# Patient Record
Sex: Female | Born: 1985 | Race: White | Hispanic: No | Marital: Married | Smoking: Never smoker
Health system: Southern US, Community
[De-identification: ages and names within clinical notes are randomized; demographics above are authoritative.]

## PROBLEM LIST (undated history)

## (undated) DIAGNOSIS — N200 Calculus of kidney: Secondary | ICD-10-CM

## (undated) DIAGNOSIS — N83209 Unspecified ovarian cyst, unspecified side: Secondary | ICD-10-CM

## (undated) DIAGNOSIS — N809 Endometriosis, unspecified: Secondary | ICD-10-CM

## (undated) DIAGNOSIS — F4310 Post-traumatic stress disorder, unspecified: Secondary | ICD-10-CM

## (undated) DIAGNOSIS — N6009 Solitary cyst of unspecified breast: Secondary | ICD-10-CM

## (undated) DIAGNOSIS — J45909 Unspecified asthma, uncomplicated: Secondary | ICD-10-CM

## (undated) DIAGNOSIS — F3181 Bipolar II disorder: Secondary | ICD-10-CM

## (undated) DIAGNOSIS — D219 Benign neoplasm of connective and other soft tissue, unspecified: Secondary | ICD-10-CM

## (undated) DIAGNOSIS — F99 Mental disorder, not otherwise specified: Secondary | ICD-10-CM

## (undated) DIAGNOSIS — F209 Schizophrenia, unspecified: Secondary | ICD-10-CM

## (undated) HISTORY — PX: CHOLECYSTECTOMY: SHX55

## (undated) HISTORY — PX: APPENDECTOMY: SHX54

---

## 2012-05-28 HISTORY — PX: ABDOMINAL HYSTERECTOMY: SHX81

## 2013-03-04 ENCOUNTER — Emergency Department (HOSPITAL_COMMUNITY)
Admission: EM | Admit: 2013-03-04 | Discharge: 2013-03-05 | Disposition: A | Discharge status: Home / Self Care | Payer: Medicaid Other | Attending: Emergency Medicine | Admitting: Emergency Medicine

## 2013-03-04 ENCOUNTER — Encounter (HOSPITAL_COMMUNITY): Payer: Self-pay | Admitting: Emergency Medicine

## 2013-03-04 ENCOUNTER — Emergency Department (HOSPITAL_COMMUNITY): Payer: Medicaid Other

## 2013-03-04 DIAGNOSIS — R0602 Shortness of breath: Secondary | ICD-10-CM | POA: Insufficient documentation

## 2013-03-04 DIAGNOSIS — R109 Unspecified abdominal pain: Secondary | ICD-10-CM | POA: Insufficient documentation

## 2013-03-04 DIAGNOSIS — J40 Bronchitis, not specified as acute or chronic: Secondary | ICD-10-CM

## 2013-03-04 DIAGNOSIS — R05 Cough: Secondary | ICD-10-CM

## 2013-03-04 DIAGNOSIS — J45901 Unspecified asthma with (acute) exacerbation: Secondary | ICD-10-CM | POA: Insufficient documentation

## 2013-03-04 DIAGNOSIS — Z87442 Personal history of urinary calculi: Secondary | ICD-10-CM | POA: Insufficient documentation

## 2013-03-04 DIAGNOSIS — Z79899 Other long term (current) drug therapy: Secondary | ICD-10-CM | POA: Insufficient documentation

## 2013-03-04 DIAGNOSIS — Z3202 Encounter for pregnancy test, result negative: Secondary | ICD-10-CM | POA: Insufficient documentation

## 2013-03-04 HISTORY — DX: Calculus of kidney: N20.0

## 2013-03-04 HISTORY — DX: Unspecified asthma, uncomplicated: J45.909

## 2013-03-04 NOTE — ED Notes (Signed)
Pt. reports persistent productive cough with nasal congestion ,headache for 2 days .

## 2013-03-05 LAB — URINALYSIS, ROUTINE W REFLEX MICROSCOPIC
Bilirubin Urine: NEGATIVE
Hgb urine dipstick: NEGATIVE
Leukocytes, UA: NEGATIVE
Nitrite: NEGATIVE
Protein, ur: NEGATIVE mg/dL
Specific Gravity, Urine: 1.022 (ref 1.005–1.030)
Urobilinogen, UA: 1 mg/dL (ref 0.0–1.0)

## 2013-03-05 LAB — POCT PREGNANCY, URINE: Preg Test, Ur: NEGATIVE

## 2013-03-05 MED ORDER — HYDROCOD POLST-CHLORPHEN POLST 10-8 MG/5ML PO LQCR: 5.0000 mL | Freq: Two times a day (BID) | ORAL | Status: DC

## 2013-03-05 MED ORDER — AZITHROMYCIN 250 MG PO TABS: ORAL_TABLET | ORAL | Status: DC

## 2013-03-05 MED ORDER — GUAIFENESIN ER 600 MG PO TB12: 1200.0000 mg | ORAL_TABLET | Freq: Two times a day (BID) | ORAL | Status: DC

## 2013-03-05 MED ORDER — HYDROCOD POLST-CHLORPHEN POLST 10-8 MG/5ML PO LQCR
5.0000 mL | Freq: Once | ORAL | Status: AC
  Administered 2013-03-05: 5 mL via ORAL
  Filled 2013-03-05: qty 5

## 2013-03-05 MED ORDER — PREDNISONE 20 MG PO TABS
60.0000 mg | ORAL_TABLET | Freq: Once | ORAL | Status: AC
  Administered 2013-03-05: 60 mg via ORAL
  Filled 2013-03-05: qty 3

## 2013-03-05 MED ORDER — PREDNISONE 10 MG PO TABS: 20.0000 mg | ORAL_TABLET | Freq: Every day | ORAL | Status: DC

## 2013-03-05 MED ORDER — IPRATROPIUM BROMIDE 0.02 % IN SOLN
0.5000 mg | Freq: Once | RESPIRATORY_TRACT | Status: AC
  Administered 2013-03-05: 0.5 mg via RESPIRATORY_TRACT
  Filled 2013-03-05: qty 2.5

## 2013-03-05 MED ORDER — ALBUTEROL SULFATE (5 MG/ML) 0.5% IN NEBU
5.0000 mg | INHALATION_SOLUTION | Freq: Once | RESPIRATORY_TRACT | Status: AC
  Administered 2013-03-05: 5 mg via RESPIRATORY_TRACT
  Filled 2013-03-05: qty 1

## 2013-03-05 NOTE — ED Provider Notes (Signed)
Medical screening examination/treatment/procedure(s) were performed by non-physician practitioner and as supervising physician I was immediately available for consultation/collaboration.   Guadalupe Kerekes, MD 03/05/13 0652 

## 2013-03-05 NOTE — ED Provider Notes (Signed)
CSN: [id number]     Arrival date & time 03/04/13  2246 History   First MD Initiated Contact with Patient 03/05/13 [phone]     Chief Complaint  Patient presents with  . Cough   HPI  History provided by the patient and significant other. Patient is a 27 year old female with history of asthma and kidney stones presenting with complaints of persistent productive cough. Coughing has been present for the past 2 weeks. It seems to be worsened with some nasal congestion over the past week. She also reports throbbing headache for the past 2 days. Patient has been using her albuterol inhalers for cough and wheezing symptoms without any significant improvement. She does report some worsening shortness of breath today with only minimal improvements from her albuterol. She denies any chest pain or hemoptysis. Patient does report having some lower flank pains that feel similar to previous kidney stones. She has not noticed any hematuria, dysuria or urinary frequency. Denies any fever, chills or sweats. No nausea vomiting.    Past Medical History  Diagnosis Date  . Asthma   . Kidney stone    Past Surgical History  Procedure Laterality Date  . Abdominal hysterectomy     No family history on file. History  Substance Use Topics  . Smoking status: Never Smoker   . Smokeless tobacco: Not on file  . Alcohol Use: No   OB History   Grav Para Term Preterm Abortions TAB SAB Ect Mult Living                 Review of Systems  Constitutional: Negative for fever, chills and diaphoresis.  Respiratory: Positive for cough, shortness of breath and wheezing.   Gastrointestinal: Negative for nausea, vomiting, diarrhea and constipation.  Genitourinary: Positive for flank pain. Negative for dysuria, frequency, hematuria, vaginal bleeding and vaginal discharge.  All other systems reviewed and are negative.    Allergies  Aspirin; Morphine and related; and Latex  Home Medications   Current Outpatient Rx  Name   Route  Sig  Dispense  Refill  . acetaminophen (TYLENOL) 500 MG tablet   Oral   Take 500 mg by mouth every 6 (six) hours as needed for pain.         Marland Kitchen albuterol (PROVENTIL HFA;VENTOLIN HFA) 108 (90 BASE) MCG/ACT inhaler   Inhalation   Inhale 1-2 puffs into the lungs every 6 (six) hours as needed for wheezing.         Marland Kitchen albuterol (PROVENTIL) (2.5 MG/3ML) 0.083% nebulizer solution   Nebulization   Take 2.5 mg by nebulization every 6 (six) hours as needed for wheezing.         . mometasone-formoterol (DULERA) 100-5 MCG/ACT AERO   Inhalation   Inhale 2 puffs into the lungs 2 (two) times daily.          BP 99/65  Pulse 70  Temp(Src) 98 F (36.7 C) (Oral)  Resp 14  Wt 122 lb (55.339 kg)  SpO2 97% Physical Exam  Nursing note and vitals reviewed. Constitutional: She is oriented to person, place, and time. She appears well-developed and well-nourished. No distress.  HENT:  Head: Normocephalic.  Cardiovascular: Normal rate and regular rhythm.   Pulmonary/Chest: Effort normal. She has wheezes.  Abdominal: Soft. There is no tenderness. There is no rebound, no guarding and no CVA tenderness.  Musculoskeletal: Normal range of motion.  Neurological: She is alert and oriented to person, place, and time.  Skin: Skin is warm and dry. No rash  noted.  Psychiatric: She has a normal mood and affect. Her behavior is normal.    ED Course  Procedures   DIAGNOSTIC STUDIES: Oxygen Saturation is 97% on room air.    COORDINATION OF CARE:  Nursing notes reviewed. Vital signs reviewed. Initial pt interview and examination performed.  Treatment plan initiated: Medications  albuterol (PROVENTIL) (5 MG/ML) 0.5% nebulizer solution 5 mg (5 mg Nebulization Given 03/05/13 0113)  ipratropium (ATROVENT) nebulizer solution 0.5 mg (0.5 mg Nebulization Given 03/05/13 0113)  chlorpheniramine-HYDROcodone (TUSSIONEX) 10-8 MG/5ML suspension 5 mL (5 mLs Oral Given 03/05/13 0113)  predniSONE (DELTASONE)  tablet 60 mg (60 mg Oral Given 03/05/13 0218)    Discussed treatment plan with pt at bedside, which includes prescriptions for cough medication, Mucinex, Z-Pak, prednisone.  Pt agrees with plan.  X-ray reviewed. Lungs clear without any concerning findings.      Results for orders placed during the hospital encounter of 03/04/13  URINALYSIS, ROUTINE W REFLEX MICROSCOPIC      Result Value Range   Color, Urine YELLOW  YELLOW   APPearance CLOUDY (*) CLEAR   Specific Gravity, Urine 1.022  1.005 - 1.030   pH 6.5  5.0 - 8.0   Glucose, UA NEGATIVE  NEGATIVE mg/dL   Hgb urine dipstick NEGATIVE  NEGATIVE   Bilirubin Urine NEGATIVE  NEGATIVE   Ketones, ur NEGATIVE  NEGATIVE mg/dL   Protein, ur NEGATIVE  NEGATIVE mg/dL   Urobilinogen, UA 1.0  0.0 - 1.0 mg/dL   Nitrite NEGATIVE  NEGATIVE   Leukocytes, UA NEGATIVE  NEGATIVE  POCT PREGNANCY, URINE      Result Value Range   Preg Test, Ur NEGATIVE  NEGATIVE      Imaging Review Dg Chest 2 View  03/05/2013   *RADIOLOGY REPORT*  Clinical Data: Cough, fever and runny nose.  History of asthma.  CHEST - 2 VIEW  Comparison: None.  Findings: The lungs are well-aerated and clear.  There is no evidence of focal opacification, pleural effusion or pneumothorax.  The heart is normal in size; the mediastinal contour is within normal limits.  No acute osseous abnormalities are seen.  Clips are noted within the right upper quadrant, reflecting prior cholecystectomy.  IMPRESSION: No acute cardiopulmonary process seen.   Original Report Authenticated By: Tonia Ghent, M.D.    MDM   1. Bronchitis   2. Cough           Angus Seller, PA-C 03/05/13 0410

## 2013-03-12 ENCOUNTER — Emergency Department (HOSPITAL_COMMUNITY)
Admission: EM | Admit: 2013-03-12 | Discharge: 2013-03-12 | Disposition: A | Discharge status: Home / Self Care | Payer: Medicaid Other | Attending: Emergency Medicine | Admitting: Emergency Medicine

## 2013-03-12 ENCOUNTER — Encounter (HOSPITAL_COMMUNITY): Payer: Self-pay | Admitting: Emergency Medicine

## 2013-03-12 DIAGNOSIS — Z792 Long term (current) use of antibiotics: Secondary | ICD-10-CM | POA: Insufficient documentation

## 2013-03-12 DIAGNOSIS — Z3202 Encounter for pregnancy test, result negative: Secondary | ICD-10-CM | POA: Insufficient documentation

## 2013-03-12 DIAGNOSIS — Z79899 Other long term (current) drug therapy: Secondary | ICD-10-CM | POA: Insufficient documentation

## 2013-03-12 DIAGNOSIS — R197 Diarrhea, unspecified: Secondary | ICD-10-CM

## 2013-03-12 DIAGNOSIS — J45909 Unspecified asthma, uncomplicated: Secondary | ICD-10-CM | POA: Insufficient documentation

## 2013-03-12 DIAGNOSIS — N390 Urinary tract infection, site not specified: Secondary | ICD-10-CM | POA: Insufficient documentation

## 2013-03-12 DIAGNOSIS — Z87442 Personal history of urinary calculi: Secondary | ICD-10-CM | POA: Insufficient documentation

## 2013-03-12 DIAGNOSIS — Z91040 Latex allergy status: Secondary | ICD-10-CM | POA: Insufficient documentation

## 2013-03-12 DIAGNOSIS — IMO0002 Reserved for concepts with insufficient information to code with codable children: Secondary | ICD-10-CM | POA: Insufficient documentation

## 2013-03-12 LAB — COMPREHENSIVE METABOLIC PANEL
ALT: 11 U/L (ref 0–35)
AST: 18 U/L (ref 0–37)
Albumin: 4.5 g/dL (ref 3.5–5.2)
Alkaline Phosphatase: 66 U/L (ref 39–117)
CO2: 26 mEq/L (ref 19–32)
Chloride: 103 mEq/L (ref 96–112)
Creatinine, Ser: 0.94 mg/dL (ref 0.50–1.10)
GFR calc non Af Amer: 84 mL/min — ABNORMAL LOW (ref >90)
Potassium: 3.8 mEq/L (ref 3.5–5.1)
Total Bilirubin: 0.6 mg/dL (ref 0.3–1.2)

## 2013-03-12 LAB — URINALYSIS, ROUTINE W REFLEX MICROSCOPIC
Bilirubin Urine: NEGATIVE
Glucose, UA: NEGATIVE mg/dL
Hgb urine dipstick: NEGATIVE
Ketones, ur: NEGATIVE mg/dL
Protein, ur: NEGATIVE mg/dL
Urobilinogen, UA: 0.2 mg/dL (ref 0.0–1.0)

## 2013-03-12 LAB — CBC WITH DIFFERENTIAL/PLATELET
Basophils Absolute: 0.1 10*3/uL (ref 0.0–0.1)
Eosinophils Absolute: 0.1 10*3/uL (ref 0.0–0.7)
Eosinophils Relative: 2 % (ref 0–5)
HCT: 44.7 % (ref 36.0–46.0)
Hemoglobin: 15.7 g/dL — ABNORMAL HIGH (ref 12.0–15.0)
Lymphocytes Relative: 51 % — ABNORMAL HIGH (ref 12–46)
MCH: 31.2 pg (ref 26.0–34.0)
MCHC: 35.1 g/dL (ref 30.0–36.0)
MCV: 88.9 fL (ref 78.0–100.0)
Monocytes Absolute: 0.3 10*3/uL (ref 0.1–1.0)
Monocytes Relative: 7 % (ref 3–12)
Platelets: 280 10*3/uL (ref 150–400)
RDW: 12.7 % (ref 11.5–15.5)
WBC: 5.1 10*3/uL (ref 4.0–10.5)

## 2013-03-12 LAB — URINE MICROSCOPIC-ADD ON

## 2013-03-12 LAB — POCT PREGNANCY, URINE: Preg Test, Ur: NEGATIVE

## 2013-03-12 MED ORDER — SODIUM CHLORIDE 0.9 % IV BOLUS (SEPSIS)
1000.0000 mL | Freq: Once | INTRAVENOUS | Status: AC
  Administered 2013-03-12: 1000 mL via INTRAVENOUS

## 2013-03-12 MED ORDER — CEPHALEXIN 500 MG PO CAPS: 500.0000 mg | ORAL_CAPSULE | Freq: Four times a day (QID) | ORAL | Status: DC

## 2013-03-12 NOTE — ED Provider Notes (Signed)
CSN: [id number]     Arrival date & time 03/12/13  7829 History   First MD Initiated Contact with Patient 03/12/13 [phone]     Chief Complaint  Patient presents with  . Diarrhea   (Consider location/radiation/quality/duration/timing/severity/associated sxs/prior Treatment) HPI Comments: Patient presents to the emergency department with chief complaint of diarrhea. Patient states that the symptoms have been ongoing for the past 4 days. She denies seeing any blood in the diarrhea. Denies any fevers, chills, nausea, vomiting, or abdominal pain. She states that she has had dysuria and frequency. Additionally, patient states that she has lost 35 pounds over the course of approximately 2 months. She is being followed by her primary care provider for the weight loss, and was recommended to followup with GI. No recent travel history, particularly to [place].  The history is provided by the patient. No language interpreter was used.    Past Medical History  Diagnosis Date  . Asthma   . Kidney stone    Past Surgical History  Procedure Laterality Date  . Abdominal hysterectomy     History reviewed. No pertinent family history. History  Substance Use Topics  . Smoking status: Never Smoker   . Smokeless tobacco: Not on file  . Alcohol Use: No   OB History   Grav Para Term Preterm Abortions TAB SAB Ect Mult Living                 Review of Systems  All other systems reviewed and are negative.    Allergies  Aspirin; Latex; and Morphine and related  Home Medications   Current Outpatient Rx  Name  Route  Sig  Dispense  Refill  . acetaminophen (TYLENOL) 500 MG tablet   Oral   Take 500 mg by mouth every 6 (six) hours as needed for pain.         Marland Kitchen albuterol (PROVENTIL HFA;VENTOLIN HFA) 108 (90 BASE) MCG/ACT inhaler   Inhalation   Inhale 1-2 puffs into the lungs every 6 (six) hours as needed for wheezing.         Marland Kitchen albuterol (PROVENTIL) (2.5 MG/3ML) 0.083% nebulizer solution  Nebulization   Take 2.5 mg by nebulization every 6 (six) hours as needed for wheezing.         Marland Kitchen azithromycin (ZITHROMAX Z-PAK) 250 MG tablet      Take 2 tabs on day one.  Take 1 tab on days 2-5.   6 tablet   0   . guaiFENesin (MUCINEX) 600 MG 12 hr tablet   Oral   Take 2 tablets (1,200 mg total) by mouth 2 (two) times daily.   60 tablet   0   . loperamide (IMODIUM) 2 MG capsule   Oral   Take 2 mg by mouth 4 (four) times daily as needed for diarrhea or loose stools.         . mometasone-formoterol (DULERA) 100-5 MCG/ACT AERO   Inhalation   Inhale 2 puffs into the lungs 2 (two) times daily.         . predniSONE (DELTASONE) 10 MG tablet   Oral   Take 2 tablets (20 mg total) by mouth daily.   10 tablet   0    BP 95/81  Pulse 73  Temp(Src) 98.3 F (36.8 C) (Oral)  Resp 16  SpO2 97% Physical Exam  Nursing note and vitals reviewed. Constitutional: She is oriented to person, place, and time. She appears well-developed and well-nourished.  Well appearing  HENT:  Head: Normocephalic and atraumatic.  Eyes: Conjunctivae and EOM are normal. Pupils are equal, round, and reactive to light.  Neck: Normal range of motion. Neck supple.  Cardiovascular: Normal rate and regular rhythm.  Exam reveals no gallop and no friction rub.   No murmur heard. Pulmonary/Chest: Effort normal and breath sounds normal. No respiratory distress. She has no wheezes. She has no rales. She exhibits no tenderness.  Abdominal: Soft. Bowel sounds are normal. She exhibits no distension and no mass. There is no tenderness. There is no rebound and no guarding.  No focal abdominal tenderness, no fluid wave, or signs of peritonitis  Musculoskeletal: Normal range of motion. She exhibits no edema and no tenderness.  Neurological: She is alert and oriented to person, place, and time.  Skin: Skin is warm and dry.  Psychiatric: She has a normal mood and affect. Her behavior is normal. Judgment and thought  content normal.    ED Course  Procedures (including critical care time) Labs Review Labs Reviewed  COMPREHENSIVE METABOLIC PANEL  BASIC METABOLIC PANEL  URINALYSIS, ROUTINE W REFLEX MICROSCOPIC   Results for orders placed during the hospital encounter of 03/12/13  COMPREHENSIVE METABOLIC PANEL      Result Value Range   Sodium 141  135 - 145 mEq/L   Potassium 3.8  3.5 - 5.1 mEq/L   Chloride 103  96 - 112 mEq/L   CO2 26  19 - 32 mEq/L   Glucose, Bld 84  70 - 99 mg/dL   BUN 15  6 - 23 mg/dL   Creatinine, Ser 1.61  0.50 - 1.10 mg/dL   Calcium 9.6  8.4 - 09.6 mg/dL   Total Protein 7.7  6.0 - 8.3 g/dL   Albumin 4.5  3.5 - 5.2 g/dL   AST 18  0 - 37 U/L   ALT 11  0 - 35 U/L   Alkaline Phosphatase 66  39 - 117 U/L   Total Bilirubin 0.6  0.3 - 1.2 mg/dL   GFR calc non Af Amer 84 (*) >90 mL/min   GFR calc Af Amer >90  >90 mL/min  URINALYSIS, ROUTINE W REFLEX MICROSCOPIC      Result Value Range   Color, Urine YELLOW  YELLOW   APPearance CLOUDY (*) CLEAR   Specific Gravity, Urine 1.028  1.005 - 1.030   pH 6.0  5.0 - 8.0   Glucose, UA NEGATIVE  NEGATIVE mg/dL   Hgb urine dipstick NEGATIVE  NEGATIVE   Bilirubin Urine NEGATIVE  NEGATIVE   Ketones, ur NEGATIVE  NEGATIVE mg/dL   Protein, ur NEGATIVE  NEGATIVE mg/dL   Urobilinogen, UA 0.2  0.0 - 1.0 mg/dL   Nitrite POSITIVE (*) NEGATIVE   Leukocytes, UA SMALL (*) NEGATIVE  CBC WITH DIFFERENTIAL      Result Value Range   WBC 5.1  4.0 - 10.5 K/uL   RBC 5.03  3.87 - 5.11 MIL/uL   Hemoglobin 15.7 (*) 12.0 - 15.0 g/dL   HCT 04.5  40.9 - 81.1 %   MCV 88.9  78.0 - 100.0 fL   MCH 31.2  26.0 - 34.0 pg   MCHC 35.1  30.0 - 36.0 g/dL   RDW 91.4  78.2 - 95.6 %   Platelets 280  150 - 400 K/uL   Neutrophils Relative % 40 (*) 43 - 77 %   Neutro Abs 2.0  1.7 - 7.7 K/uL   Lymphocytes Relative 51 (*) 12 - 46 %   Lymphs Abs 2.6  0.7 - 4.0 K/uL   Monocytes Relative 7  3 - 12 %   Monocytes Absolute 0.3  0.1 - 1.0 K/uL   Eosinophils Relative 2  0  - 5 %   Eosinophils Absolute 0.1  0.0 - 0.7 K/uL   Basophils Relative 1  0 - 1 %   Basophils Absolute 0.1  0.0 - 0.1 K/uL  URINE MICROSCOPIC-ADD ON      Result Value Range   Squamous Epithelial / LPF FEW (*) RARE   WBC, UA 7-10  <3 WBC/hpf   Bacteria, UA MANY (*) RARE  POCT PREGNANCY, URINE      Result Value Range   Preg Test, Ur NEGATIVE  NEGATIVE   Dg Chest 2 View  03/05/2013   *RADIOLOGY REPORT*  Clinical Data: Cough, fever and runny nose.  History of asthma.  CHEST - 2 VIEW  Comparison: None.  Findings: The lungs are well-aerated and clear.  There is no evidence of focal opacification, pleural effusion or pneumothorax.  The heart is normal in size; the mediastinal contour is within normal limits.  No acute osseous abnormalities are seen.  Clips are noted within the right upper quadrant, reflecting prior cholecystectomy.  IMPRESSION: No acute cardiopulmonary process seen.   Original Report Authenticated By: Tonia Ghent, M.D.     MDM   1. UTI (lower urinary tract infection)   2. Diarrhea     Patient with history of diarrhea, as well as 35 pound weight loss in the past 2 months. Check basic labs, give fluids, and will reevaluate. No recent travel.  No hemoptysis.  No blood in stool. No fever, vitals are stable.   Patient discussed with Dr. Bebe Shaggy, who agrees with the plan. Followup with primary care provider or with gastroenterology for weight loss and persistent diarrhea. Return precautions are given.    Roxy Horseman, PA-C 03/12/13 1123

## 2013-03-12 NOTE — ED Notes (Addendum)
Presents to ED with c/o diarrhea, onset x4 day. Pt also c/o unintentional weight loss about 35 pounds in last 2 months. Pt taking antibiotic Zithromax for bronchitis.

## 2013-03-13 NOTE — ED Provider Notes (Signed)
Medical screening examination/treatment/procedure(s) were performed by non-physician practitioner and as supervising physician I was immediately available for consultation/collaboration.   Joya Gaskins, MD 03/13/13 [phone]

## 2013-03-14 LAB — URINE CULTURE

## 2013-03-15 ENCOUNTER — Telehealth (HOSPITAL_COMMUNITY): Payer: Self-pay | Admitting: Emergency Medicine

## 2013-03-15 NOTE — ED Notes (Signed)
Post ED Visit - Positive Culture Follow-up  Culture report reviewed by antimicrobial stewardship pharmacist: []  Wes Dulaney, Pharm.D., BCPS []  Celedonio Miyamoto, Pharm.D., BCPS []  Georgina Pillion, Pharm.D., BCPS []  [place], [address].D., BCPS, AAHIVP []  Estella Husk, Pharm.D., BCPS, AAHIVP [x]  Nadara Mustard, [address].D., BCPS  Positive urine culture Treated with Keflex, organism sensitive to the same and no further patient follow-up is required at this time.  Kylie A Holland 03/15/2013, 1:23 PM

## 2013-03-19 ENCOUNTER — Encounter: Payer: Self-pay | Admitting: Gastroenterology

## 2013-03-31 ENCOUNTER — Ambulatory Visit: Payer: Medicaid Other | Admitting: Gastroenterology

## 2013-05-07 ENCOUNTER — Encounter (HOSPITAL_COMMUNITY): Payer: Self-pay | Admitting: Emergency Medicine

## 2013-05-07 ENCOUNTER — Emergency Department (HOSPITAL_COMMUNITY)
Admission: EM | Admit: 2013-05-07 | Discharge: 2013-05-07 | Disposition: A | Discharge status: Home / Self Care | Payer: Medicaid Other | Attending: Emergency Medicine | Admitting: Emergency Medicine

## 2013-05-07 DIAGNOSIS — R109 Unspecified abdominal pain: Secondary | ICD-10-CM

## 2013-05-07 DIAGNOSIS — F17200 Nicotine dependence, unspecified, uncomplicated: Secondary | ICD-10-CM | POA: Insufficient documentation

## 2013-05-07 DIAGNOSIS — Z79899 Other long term (current) drug therapy: Secondary | ICD-10-CM | POA: Insufficient documentation

## 2013-05-07 DIAGNOSIS — J45909 Unspecified asthma, uncomplicated: Secondary | ICD-10-CM | POA: Insufficient documentation

## 2013-05-07 DIAGNOSIS — IMO0002 Reserved for concepts with insufficient information to code with codable children: Secondary | ICD-10-CM | POA: Insufficient documentation

## 2013-05-07 DIAGNOSIS — R1012 Left upper quadrant pain: Secondary | ICD-10-CM | POA: Insufficient documentation

## 2013-05-07 DIAGNOSIS — Z91040 Latex allergy status: Secondary | ICD-10-CM | POA: Insufficient documentation

## 2013-05-07 DIAGNOSIS — Z87442 Personal history of urinary calculi: Secondary | ICD-10-CM | POA: Insufficient documentation

## 2013-05-07 DIAGNOSIS — Z792 Long term (current) use of antibiotics: Secondary | ICD-10-CM | POA: Insufficient documentation

## 2013-05-07 LAB — CBC WITH DIFFERENTIAL/PLATELET
Basophils Absolute: 0 10*3/uL (ref 0.0–0.1)
Basophils Relative: 0 % (ref 0–1)
Eosinophils Relative: 2 % (ref 0–5)
HCT: 43.3 % (ref 36.0–46.0)
Lymphocytes Relative: 51 % — ABNORMAL HIGH (ref 12–46)
Lymphs Abs: 3.5 10*3/uL (ref 0.7–4.0)
MCHC: 34.6 g/dL (ref 30.0–36.0)
MCV: 91.7 fL (ref 78.0–100.0)
Monocytes Absolute: 0.4 10*3/uL (ref 0.1–1.0)
Neutro Abs: 2.8 10*3/uL (ref 1.7–7.7)
Neutrophils Relative %: 41 % — ABNORMAL LOW (ref 43–77)
Platelets: 263 10*3/uL (ref 150–400)
RDW: 12.8 % (ref 11.5–15.5)

## 2013-05-07 LAB — COMPREHENSIVE METABOLIC PANEL
ALT: 10 U/L (ref 0–35)
AST: 19 U/L (ref 0–37)
Albumin: 4.1 g/dL (ref 3.5–5.2)
Calcium: 8.8 mg/dL (ref 8.4–10.5)
Creatinine, Ser: 0.85 mg/dL (ref 0.50–1.10)
Glucose, Bld: 79 mg/dL (ref 70–99)
Potassium: 3.9 mEq/L (ref 3.5–5.1)
Sodium: 141 mEq/L (ref 135–145)
Total Bilirubin: 0.3 mg/dL (ref 0.3–1.2)
Total Protein: 6.9 g/dL (ref 6.0–8.3)

## 2013-05-07 LAB — URINALYSIS, ROUTINE W REFLEX MICROSCOPIC
Glucose, UA: NEGATIVE mg/dL
Leukocytes, UA: NEGATIVE
Protein, ur: NEGATIVE mg/dL
Specific Gravity, Urine: 1.029 (ref 1.005–1.030)
pH: 7.5 (ref 5.0–8.0)

## 2013-05-07 MED ORDER — SUCRALFATE 1 GM/10ML PO SUSP: 1.0000 g | Freq: Three times a day (TID) | ORAL | Status: DC

## 2013-05-07 MED ORDER — GI COCKTAIL ~~LOC~~
30.0000 mL | Freq: Once | ORAL | Status: AC
  Administered 2013-05-07: 30 mL via ORAL
  Filled 2013-05-07: qty 30

## 2013-05-07 MED ORDER — PANTOPRAZOLE SODIUM 40 MG PO TBEC: 40.0000 mg | DELAYED_RELEASE_TABLET | Freq: Every day | ORAL | Status: DC

## 2013-05-07 NOTE — ED Provider Notes (Signed)
CSN: [id number]     Arrival date & time 05/07/13  1537 History   None    Chief Complaint  Patient presents with  . Abdominal Pain   (Consider location/radiation/quality/duration/timing/severity/associated sxs/prior Treatment) HPI [name] is a 27 y.o. female w/ PMHx of Asthma and previous kidney stone, presents to the ED w/ complaints of epigastric and LUQ pain since yesterday. The patient claims she was doing house work when she started to feel a dull ache in her abdomen the then progressed to a sharp pain located in the epigastrium She claims the pain is worsened with coughing and sneezing. She denies any associated nausea, vomiting, fever, chills, diarrhea, or dysuria. The patient says she has a history of "migraine" headaches which have been very frequent over the past week, for which she takes 4-6 motrin a day recently. The patient also denies any hematochezia or melena. The patient has had an appendectomy and a cholecystectomy in the past.   Past Medical History  Diagnosis Date  . Asthma   . Kidney stone    Past Surgical History  Procedure Laterality Date  . Abdominal hysterectomy    . Cholecystectomy    . Appendectomy     History reviewed. No pertinent family history. History  Substance Use Topics  . Smoking status: Current Every Day Smoker  . Smokeless tobacco: Not on file  . Alcohol Use: No   OB History   Grav Para Term Preterm Abortions TAB SAB Ect Mult Living                 Review of Systems General: Denies fever, chills, diaphoresis, appetite change and fatigue.  Respiratory: Denies SOB, DOE, cough, chest tightness, and wheezing.   Cardiovascular: Denies chest pain, palpitations and leg swelling.  Gastrointestinal: Positive for epigastric and LUQ pain. Denies nausea, vomiting, diarrhea, constipation, blood in stool and abdominal distention.  Genitourinary: Denies dysuria, urgency, frequency, hematuria, flank pain and difficulty urinating.   Musculoskeletal: Denies myalgias, back pain, joint swelling, arthralgias and gait problem.  Skin: Denies pallor, rash and wounds.  Neurological: Denies dizziness, seizures, syncope, weakness, lightheadedness, numbness and headaches.  Psychiatric/Behavioral: Denies mood changes, confusion, nervousness, sleep disturbance and agitation.  Allergies  Aspirin; Latex; and Morphine and related  Home Medications   Current Outpatient Rx  Name  Route  Sig  Dispense  Refill  . acetaminophen (TYLENOL) 500 MG tablet   Oral   Take 500 mg by mouth every 6 (six) hours as needed for pain.         Hanson Kitchen albuterol (PROVENTIL HFA;VENTOLIN HFA) 108 (90 BASE) MCG/ACT inhaler   Inhalation   Inhale 1-2 puffs into the lungs every 6 (six) hours as needed for wheezing.         Hanson Kitchen albuterol (PROVENTIL) (2.5 MG/3ML) 0.083% nebulizer solution   Nebulization   Take 2.5 mg by nebulization every 6 (six) hours as needed for wheezing.         Hanson Kitchen azithromycin (ZITHROMAX Z-PAK) 250 MG tablet      Take 2 tabs on day one.  Take 1 tab on days 2-5.   6 tablet   0   . cephALEXin (KEFLEX) 500 MG capsule   Oral   Take 1 capsule (500 mg total) by mouth 4 (four) times daily.   40 capsule   0   . guaiFENesin (MUCINEX) 600 MG 12 hr tablet   Oral   Take 2 tablets (1,200 mg total) by mouth 2 (two) times daily.  60 tablet   0   . loperamide (IMODIUM) 2 MG capsule   Oral   Take 2 mg by mouth 4 (four) times daily as needed for diarrhea or loose stools.         . mometasone-formoterol (DULERA) 100-5 MCG/ACT AERO   Inhalation   Inhale 2 puffs into the lungs 2 (two) times daily.         . predniSONE (DELTASONE) 10 MG tablet   Oral   Take 2 tablets (20 mg total) by mouth daily.   10 tablet   0    Physical Exam Filed Vitals:   05/07/13 1544  BP: 117/69  Pulse: 67  Temp: 98 F (36.7 C)  TempSrc: Oral  Resp: 18  SpO2: 99%  General: Vital signs reviewed.  Patient is a well-developed and well-nourished,  in no acute distress and cooperative with exam. Alert and oriented x3.  Head: Normocephalic and atraumatic. Eyes: PERRL, EOMI, conjunctivae normal, No scleral icterus.  Neck: Supple, trachea midline, normal ROM, No JVD, masses, thyromegaly, or carotid bruit present.  Cardiovascular: RRR, S1 normal, S2 normal, no murmurs, gallops, or rubs. Pulmonary/Chest: Normal respiratory effort, CTAB, no wheezes, rales, or rhonchi. Abdominal: Soft, tender to palpation in the epigastrium and LUQ w/ minimal pain on rebound. Non-distended, bowel sounds are normal, no masses, organomegaly, or guarding present.  Musculoskeletal: No joint deformities, erythema, or stiffness, ROM full and no nontender. Extremities: No swelling or edema,  pulses symmetric and intact bilaterally. No cyanosis or clubbing. Neurological: A&O x3, Strength is normal and symmetric bilaterally, cranial nerve II-XII are grossly intact, no focal motor deficit, sensory intact to light touch bilaterally.  Skin: Warm, dry and intact. No rashes or erythema. Psychiatric: Normal mood and affect. speech and behavior is normal. Cognition and memory are normal.   ED Course  Procedures (including critical care time) Labs Review Labs Reviewed  CBC WITH DIFFERENTIAL  COMPREHENSIVE METABOLIC PANEL  LIPASE, BLOOD  URINALYSIS, ROUTINE W REFLEX MICROSCOPIC   Imaging Review No results found.  EKG Interpretation   None       MDM   [name] is a 27 y.o. female w/ PMHx of Asthma and previous kidney stone, presents to the ED w/ complaints of epigastric and LUQ pain. No associated N/V/D. Has been taking Motrin 4-6 times daily for worsening headaches. No fever, chills, or melena. Has had appendectomy and cholecystectomy in the past. No trauma, no history of alcohol abuse. Most likely gastritis at this time. -UA, CBC, CMP, and lipase, all wnl -GI cocktail provided relief.  Given labs and clinical presentation, suspect gastritis at this  time. Patient stable for discharge. Will give Rx for Protonix 40 mg po qd + Carafate susp AC/HS for discharge. Patient encouraged to follow up w/ [facility]   Courtney Paris, MD 05/07/13 1723

## 2013-05-07 NOTE — ED Notes (Signed)
Pt c/o mid abd pain worse with cough and movement x 2 days; pt denies N/V/D

## 2013-05-08 NOTE — ED Provider Notes (Signed)
This patient was seen in conjunction with the resident physician, Dr. Yetta Barre.  The documentation is accurate and details the evaluation.  On my exam, patient had epigastric pain.  Patient's condition improved substantially with GI cocktail.  This episode of pain likely due to gastritis versus gastroesophageal irritation.  With her improvement, no notable abnormal findings, she is appropriate for discharge with outpatient followup.  Gerhard Munch, MD 05/08/13 [phone]

## 2013-09-02 ENCOUNTER — Encounter (HOSPITAL_COMMUNITY): Payer: Self-pay | Admitting: *Deleted

## 2013-09-02 ENCOUNTER — Inpatient Hospital Stay (HOSPITAL_COMMUNITY): Payer: Medicaid Other

## 2013-09-02 ENCOUNTER — Inpatient Hospital Stay (HOSPITAL_COMMUNITY)
Admission: AD | Admit: 2013-09-02 | Discharge: 2013-09-02 | Disposition: A | Discharge status: Home / Self Care | Payer: Medicaid Other | Source: Ambulatory Visit | Attending: Family Medicine | Admitting: Family Medicine

## 2013-09-02 DIAGNOSIS — M549 Dorsalgia, unspecified: Secondary | ICD-10-CM | POA: Insufficient documentation

## 2013-09-02 DIAGNOSIS — R109 Unspecified abdominal pain: Secondary | ICD-10-CM

## 2013-09-02 DIAGNOSIS — Z90710 Acquired absence of both cervix and uterus: Secondary | ICD-10-CM | POA: Insufficient documentation

## 2013-09-02 DIAGNOSIS — N949 Unspecified condition associated with female genital organs and menstrual cycle: Secondary | ICD-10-CM | POA: Insufficient documentation

## 2013-09-02 DIAGNOSIS — J45909 Unspecified asthma, uncomplicated: Secondary | ICD-10-CM | POA: Insufficient documentation

## 2013-09-02 DIAGNOSIS — R102 Pelvic and perineal pain: Secondary | ICD-10-CM

## 2013-09-02 DIAGNOSIS — R1031 Right lower quadrant pain: Secondary | ICD-10-CM | POA: Insufficient documentation

## 2013-09-02 HISTORY — DX: Endometriosis, unspecified: N80.9

## 2013-09-02 HISTORY — DX: Benign neoplasm of connective and other soft tissue, unspecified: D21.9

## 2013-09-02 HISTORY — DX: Unspecified ovarian cyst, unspecified side: N83.209

## 2013-09-02 LAB — URINALYSIS, ROUTINE W REFLEX MICROSCOPIC
BILIRUBIN URINE: NEGATIVE
Glucose, UA: NEGATIVE mg/dL
Hgb urine dipstick: NEGATIVE
Ketones, ur: 15 mg/dL — AB
Leukocytes, UA: NEGATIVE
NITRITE: NEGATIVE
Protein, ur: NEGATIVE mg/dL
UROBILINOGEN UA: 0.2 mg/dL (ref 0.0–1.0)
pH: 5.5 (ref 5.0–8.0)

## 2013-09-02 LAB — WET PREP, GENITAL
Clue Cells Wet Prep HPF POC: NONE SEEN
TRICH WET PREP: NONE SEEN
WBC, Wet Prep HPF POC: NONE SEEN
Yeast Wet Prep HPF POC: NONE SEEN

## 2013-09-02 MED ORDER — KETOROLAC TROMETHAMINE 60 MG/2ML IM SOLN
60.0000 mg | Freq: Once | INTRAMUSCULAR | Status: AC
  Administered 2013-09-02: 60 mg via INTRAMUSCULAR
  Filled 2013-09-02: qty 2

## 2013-09-02 MED ORDER — IBUPROFEN 600 MG PO TABS: 600.0000 mg | ORAL_TABLET | Freq: Four times a day (QID) | ORAL | Status: DC | PRN

## 2013-09-02 NOTE — MAU Note (Addendum)
Had a hysterectomy last January 2014 due to fibroids and endometriosis and " I have been having problems with my ovaries ever since." States she has pain with intercourse. States she has lost 20-30 lbs since January 2014. Started spotting yesterday. States had hysterectomy in [place], [place]. States she has frequent UTI's.

## 2013-09-02 NOTE — MAU Provider Note (Signed)
History     CSN: [id number]  Arrival date and time: 09/02/13 1606   First Provider Initiated Contact with Patient 09/02/13 1652      Chief Complaint  Patient presents with  . Abdominal Pain   HPI  Pt is a 28 yo G3P2103 here with report of lower right sided pelvic pain that started shortly after having a hysterectomy in January 2014.  Pain occurs daily and is intermittent in nature.  Pain is described as "crampy" and rated a 7/10.  Reports loss of 30 lbs in one year.    Past Medical History  Diagnosis Date  . Asthma   . Kidney stone   . Fibroid   . Endometriosis   . Ovarian cyst     Past Surgical History  Procedure Laterality Date  . Cholecystectomy    . Appendectomy    . Abdominal hysterectomy  2014    History reviewed. No pertinent family history.  History  Substance Use Topics  . Smoking status: Never Smoker   . Smokeless tobacco: Not on file  . Alcohol Use: No    Allergies:  Allergies  Allergen Reactions  . Aspirin Rash  . Latex Rash  . Morphine And Related Rash    Prescriptions prior to admission  Medication Sig Dispense Refill  . albuterol (PROVENTIL HFA;VENTOLIN HFA) 108 (90 BASE) MCG/ACT inhaler Inhale 1-2 puffs into the lungs every 6 (six) hours as needed for wheezing.      . mometasone-formoterol (DULERA) 100-5 MCG/ACT AERO Inhale 2 puffs into the lungs 2 (two) times daily.      . Multiple Vitamins-Minerals (MULTIVITAMIN PO) Take 2 tablets by mouth daily.      . sucralfate (CARAFATE) 1 GM/10ML suspension Take 10 mLs (1 g total) by mouth 4 (four) times daily -  with meals and at bedtime.  420 mL  0  . albuterol (PROVENTIL) (2.5 MG/3ML) 0.083% nebulizer solution Take 2.5 mg by nebulization every 6 (six) hours as needed for wheezing.        Review of Systems  Constitutional: Positive for weight loss and malaise/fatigue.  Gastrointestinal: Positive for abdominal pain (pelvic). Negative for nausea, vomiting and constipation.  Genitourinary: Positive  for frequency.  Musculoskeletal: Positive for back pain.  All other systems reviewed and are negative.  Physical Exam   Blood pressure 120/73, pulse 71, temperature 98.3 F (36.8 C), temperature source Oral, resp. rate 18, height 5\' 5"  (1.651 m), weight 53.978 kg (119 lb).  Physical Exam  Constitutional: She is oriented to person, place, and time. She appears well-developed and well-nourished.  HENT:  Head: Normocephalic.  Eyes: Pupils are equal, round, and reactive to light.  Neck: Normal range of motion. Neck supple.  Cardiovascular: Normal rate, regular rhythm and normal heart sounds.   Respiratory: Effort normal and breath sounds normal.  GI: Soft. She exhibits no mass. There is tenderness. There is no guarding.  Genitourinary: Right adnexum displays tenderness. Right adnexum displays no mass. Left adnexum displays no mass and no tenderness. Vaginal discharge (white, creamy) found.  Neurological: She is alert and oriented to person, place, and time. She has normal reflexes.  Skin: Skin is warm and dry.    MAU Course  Procedures Results for orders placed during the hospital encounter of 09/02/13 (from the past 24 hour(s))  URINALYSIS, ROUTINE W REFLEX MICROSCOPIC     Status: Abnormal   Collection Time    09/02/13  4:15 PM      Result Value Ref Range  Color, Urine YELLOW  YELLOW   APPearance CLEAR  CLEAR   Specific Gravity, Urine >1.030 (*) 1.005 - 1.030   pH 5.5  5.0 - 8.0   Glucose, UA NEGATIVE  NEGATIVE mg/dL   Hgb urine dipstick NEGATIVE  NEGATIVE   Bilirubin Urine NEGATIVE  NEGATIVE   Ketones, ur 15 (*) NEGATIVE mg/dL   Protein, ur NEGATIVE  NEGATIVE mg/dL   Urobilinogen, UA 0.2  0.0 - 1.0 mg/dL   Nitrite NEGATIVE  NEGATIVE   Leukocytes, UA NEGATIVE  NEGATIVE  WET PREP, GENITAL     Status: None   Collection Time    09/02/13  5:02 PM      Result Value Ref Range   Yeast Wet Prep HPF POC NONE SEEN  NONE SEEN   Trich, Wet Prep NONE SEEN  NONE SEEN   Clue Cells Wet  Prep HPF POC NONE SEEN  NONE SEEN   WBC, Wet Prep HPF POC NONE SEEN  NONE SEEN   Ultrasound FINDINGS:  Uterus  Previous hysterectomy.  Endometrium  Previous hysterectomy.  Right ovary  Measurements: 2.3 x 2.9 x 2.7 cm. Normal appearance/no adnexal mass.  Left ovary  Measurements: 1.7 x 2.2 x 2.9 cm. Normal appearance/no adnexal mass.  Other findings  No free fluid. Normal color flow to both ovaries. Bladder is  unremarkable.  IMPRESSION:  Prior hysterectomy. Otherwise, unremarkable pelvic ultrasound.  Assessment and Plan  Pelvic Pain  Plan: Discharge to home RX Ibuprofen Refer to GYN - ? Endometriosis  Joelyn Oms, CNM

## 2013-09-03 NOTE — MAU Provider Note (Signed)
Attestation of Attending Supervision of Advanced Practitioner (PA/CNM/NP): Evaluation and management procedures were performed by the Advanced Practitioner under my supervision and collaboration.  I have reviewed the Advanced Practitioner's note and chart, and I agree with the management and plan.  Jacob Stinson, DO Attending Physician Faculty [facility], [facility]  

## 2013-10-14 ENCOUNTER — Encounter: Payer: Medicaid Other | Admitting: Obstetrics & Gynecology

## 2013-12-17 ENCOUNTER — Encounter: Payer: Self-pay | Admitting: Obstetrics & Gynecology

## 2013-12-17 ENCOUNTER — Ambulatory Visit (INDEPENDENT_AMBULATORY_CARE_PROVIDER_SITE_OTHER): Payer: Medicaid Other | Admitting: Obstetrics & Gynecology

## 2013-12-17 VITALS — BP 116/65 | HR 71 | Temp 98.0°F | Ht 66.0 in | Wt 109.9 lb

## 2013-12-17 DIAGNOSIS — R634 Abnormal weight loss: Secondary | ICD-10-CM

## 2013-12-17 DIAGNOSIS — R102 Pelvic and perineal pain: Secondary | ICD-10-CM

## 2013-12-17 DIAGNOSIS — N809 Endometriosis, unspecified: Secondary | ICD-10-CM

## 2013-12-17 DIAGNOSIS — N949 Unspecified condition associated with female genital organs and menstrual cycle: Secondary | ICD-10-CM

## 2013-12-17 MED ORDER — MEDROXYPROGESTERONE ACETATE 150 MG/ML IM SUSP
150.0000 mg | INTRAMUSCULAR | Status: DC
  Administered 2013-12-17: 150 mg via INTRAMUSCULAR

## 2013-12-17 NOTE — Progress Notes (Signed)
Patient reports on and off abdominal/pelvic pain and describes it has feeling like "how your uterus contracts after having a baby but there is no uterus there." Patient c/o of dropping from 140lb to 109lb in the last 4 months; patient does not have appetite. Patient also reports painful lump in right breast that occurs once a month lasting for a few days though reports this month painful lump lasted for 2 weeks.

## 2013-12-17 NOTE — Patient Instructions (Signed)
Pelvic Pain Female pelvic pain can be caused by many different things and start from a variety of places. Pelvic pain refers to pain that is located in the lower half of the abdomen and between your hips. The pain may occur over a short period of time (acute) or may be reoccurring (chronic). The cause of pelvic pain may be related to disorders affecting the female reproductive organs (gynecologic), but it may also be related to the bladder, kidney stones, an intestinal complication, or muscle or skeletal problems. Getting help right away for pelvic pain is important, especially if there has been severe, sharp, or a sudden onset of unusual pain. It is also important to get help right away because some types of pelvic pain can be life threatening.  CAUSES  Below are only some of the causes of pelvic pain. The causes of pelvic pain can be in one of several categories.   Gynecologic.  Pelvic inflammatory disease.  Sexually transmitted infection.  Ovarian cyst or a twisted ovarian ligament (ovarian torsion).  Uterine lining that grows outside the uterus (endometriosis).  Fibroids, cysts, or tumors.  Ovulation.  Pregnancy.  Pregnancy that occurs outside the uterus (ectopic pregnancy).  Miscarriage.  Labor.  Abruption of the placenta or ruptured uterus.  Infection.  Uterine infection (endometritis).  Bladder infection.  Diverticulitis.  Miscarriage related to a uterine infection (septic abortion).  Bladder.  Inflammation of the bladder (cystitis).  Kidney stone(s).  Gastrointestinal.  Constipation.  Diverticulitis.  Neurologic.  Trauma.  Feeling pelvic pain because of mental or emotional causes (psychosomatic).  Cancers of the bowel or pelvis. EVALUATION  Your caregiver will want to take a careful history of your concerns. This includes recent changes in your health, a careful gynecologic history of your periods (menses), and a sexual history. Obtaining your family  history and medical history is also important. Your caregiver may suggest a pelvic exam. A pelvic exam will help identify the location and severity of the pain. It also helps in the evaluation of which organ system may be involved. In order to identify the cause of the pelvic pain and be properly treated, your caregiver may order tests. These tests may include:   A pregnancy test.  Pelvic ultrasonography.  An X-ray exam of the abdomen.  A urinalysis or evaluation of vaginal discharge.  Blood tests. HOME CARE INSTRUCTIONS   Only take over-the-counter or prescription medicines for pain, discomfort, or fever as directed by your caregiver.   Rest as directed by your caregiver.   Eat a balanced diet.   Drink enough fluids to make your urine clear or pale yellow, or as directed.   Avoid sexual intercourse if it causes pain.   Apply warm or cold compresses to the lower abdomen depending on which one helps the pain.   Avoid stressful situations.   Keep a journal of your pelvic pain. Write down when it started, where the pain is located, and if there are things that seem to be associated with the pain, such as food or your menstrual cycle.  Follow up with your caregiver as directed.  SEEK MEDICAL CARE IF:  Your medicine does not help your pain.  You have abnormal vaginal discharge. SEEK IMMEDIATE MEDICAL CARE IF:   You have heavy bleeding from the vagina.   Your pelvic pain increases.   You feel light-headed or faint.   You have chills.   You have pain with urination or blood in your urine.   You have uncontrolled diarrhea   or vomiting.   You have a fever or persistent symptoms for more than 3 days.  You have a fever and your symptoms suddenly get worse.   You are being physically or sexually abused.  MAKE SURE YOU:  Understand these instructions.  Will watch your condition.  Will get help if you are not doing well or get worse. Document Released:  04/10/2004 Document Revised: 09/28/2013 Document Reviewed: 09/03/2011 Exit[employer] Patient Information 2015 [employer]. This information is not intended to replace advice given to you by your health care provider. Make sure you discuss any questions you have with your health care provider.  

## 2013-12-17 NOTE — Progress Notes (Signed)
Subjective:     Patient ID: Jane Hanson, female   DOB: 10-25-1985, 28 y.o.   MRN: [id number]  HPI pt c/o abdominal pain since after hyst in 2014.  Pt had hysterectomy for fibroids and endometriosis.  She has chronic pain PRIOR to surgery. After surgery the pain became more central.  Pt reports that she has a sono that showed fluid 'but they did not know where it was coming from'.  She reports frequent UTI's since hyst.  At least monthly.   Pt report daily stools. She denies diarrhea  Pt also denies fever or chills. Pt reports excessive weight loss and reports that she is smaller than she has ever been.    Past Surgical History  Procedure Laterality Date  . Cholecystectomy    . Appendectomy    . Abdominal hysterectomy  2014   Past Medical History  Diagnosis Date  . Asthma   . Kidney stone   . Fibroid   . Endometriosis   . Ovarian cyst    Current Outpatient Prescriptions on File Prior to Visit  Medication Sig Dispense Refill  . albuterol (PROVENTIL HFA;VENTOLIN HFA) 108 (90 BASE) MCG/ACT inhaler Inhale 1-2 puffs into the lungs every 6 (six) hours as needed for wheezing.      Marland Kitchen albuterol (PROVENTIL) (2.5 MG/3ML) 0.083% nebulizer solution Take 2.5 mg by nebulization every 6 (six) hours as needed for wheezing.      Marland Kitchen ibuprofen (ADVIL,MOTRIN) 600 MG tablet Take 1 tablet (600 mg total) by mouth every 6 (six) hours as needed.  30 tablet  0  . mometasone-formoterol (DULERA) 100-5 MCG/ACT AERO Inhale 2 puffs into the lungs 2 (two) times daily.      . Multiple Vitamins-Minerals (MULTIVITAMIN PO) Take 2 tablets by mouth daily.      . sucralfate (CARAFATE) 1 GM/10ML suspension Take 10 mLs (1 g total) by mouth 4 (four) times daily -  with meals and at bedtime.  420 mL  0   No current facility-administered medications on file prior to visit.   Allergies  Allergen Reactions  . Aspirin Rash  . Latex Rash  . Morphine And Related Rash   History   Social History  . Marital Status: Married   Spouse Name: N/A    Number of Children: N/A  . Years of Education: N/A   Occupational History  . Not on file.   Social History Main Topics  . Smoking status: Never Smoker   . Smokeless tobacco: Never Used  . Alcohol Use: No  . Drug Use: Yes    Special: Marijuana     Comment: not currently  . Sexual Activity: Yes    Birth Control/ Protection: Surgical   Other Topics Concern  . Not on file   Social History Narrative  . No narrative on file   History reviewed. No pertinent family history.     Review of Systems     Objective:   Physical Exam BP 116/65  Pulse 71  Temp(Src) 98 F (36.7 C) (Oral)  Ht 5\' 6"  (1.676 m)  Wt 109 lb 14.4 oz (49.85 kg)  BMI 17.75 kg/m2 Pt in NAD Soft, NT, ND Abd: no incision, NT, ND GU: EGBUS: no lesions Vagina: no blood in vault Cervix?Uterus: surgically Adnexa: no masses; sl tender  Breast exam: small breasts/ symmetric.  No masses, no skin changes, no masses      09/02/2013 EXAM:  TRANSABDOMINAL AND TRANSVAGINAL ULTRASOUND OF PELVIS  TECHNIQUE:  Both transabdominal and transvaginal ultrasound examinations  of the  pelvis were performed. Transabdominal technique was performed for  global imaging of the pelvis including uterus, ovaries, adnexal  regions, and pelvic cul-de-sac. It was necessary to proceed with  endovaginal exam following the transabdominal exam to visualize the  endometrium and ovaries.  COMPARISON: None  FINDINGS:  Uterus  Previous hysterectomy.  Endometrium  Previous hysterectomy.  Right ovary  Measurements: 2.3 x 2.9 x 2.7 cm. Normal appearance/no adnexal mass.  Left ovary  Measurements: 1.7 x 2.2 x 2.9 cm. Normal appearance/no adnexal mass.  Other findings  No free fluid. Normal color flow to both ovaries. Bladder is  unremarkable.  IMPRESSION:  Prior hysterectomy. Otherwise, unremarkable pelvic ultrasound.     Assessment:     Chronic pelvic pain- uncertain etiology.  Possible related to  endometriosis Breast pain/cysts- suspect benign.  Not present currently     Plan:     Lab: TSH Depo Provera 150mg  IM x 1 today repeat in 3 months F/u 74months Records from prev surgery

## 2013-12-18 LAB — TSH: TSH: 0.729 u[IU]/mL (ref 0.350–4.500)

## 2014-02-14 ENCOUNTER — Encounter (HOSPITAL_COMMUNITY): Payer: Self-pay | Admitting: Emergency Medicine

## 2014-02-14 ENCOUNTER — Emergency Department (HOSPITAL_COMMUNITY): Payer: Medicaid Other

## 2014-02-14 ENCOUNTER — Emergency Department (HOSPITAL_COMMUNITY)
Admission: EM | Admit: 2014-02-14 | Discharge: 2014-02-14 | Disposition: A | Discharge status: Home / Self Care | Payer: Medicaid Other | Attending: Emergency Medicine | Admitting: Emergency Medicine

## 2014-02-14 DIAGNOSIS — A499 Bacterial infection, unspecified: Secondary | ICD-10-CM | POA: Insufficient documentation

## 2014-02-14 DIAGNOSIS — R059 Cough, unspecified: Secondary | ICD-10-CM | POA: Insufficient documentation

## 2014-02-14 DIAGNOSIS — J45909 Unspecified asthma, uncomplicated: Secondary | ICD-10-CM | POA: Insufficient documentation

## 2014-02-14 DIAGNOSIS — Z3202 Encounter for pregnancy test, result negative: Secondary | ICD-10-CM | POA: Insufficient documentation

## 2014-02-14 DIAGNOSIS — Z79899 Other long term (current) drug therapy: Secondary | ICD-10-CM | POA: Diagnosis not present

## 2014-02-14 DIAGNOSIS — Z91040 Latex allergy status: Secondary | ICD-10-CM | POA: Diagnosis not present

## 2014-02-14 DIAGNOSIS — IMO0002 Reserved for concepts with insufficient information to code with codable children: Secondary | ICD-10-CM | POA: Insufficient documentation

## 2014-02-14 DIAGNOSIS — N760 Acute vaginitis: Secondary | ICD-10-CM | POA: Diagnosis not present

## 2014-02-14 DIAGNOSIS — B9689 Other specified bacterial agents as the cause of diseases classified elsewhere: Secondary | ICD-10-CM | POA: Diagnosis not present

## 2014-02-14 DIAGNOSIS — R05 Cough: Secondary | ICD-10-CM | POA: Insufficient documentation

## 2014-02-14 DIAGNOSIS — J069 Acute upper respiratory infection, unspecified: Secondary | ICD-10-CM | POA: Diagnosis not present

## 2014-02-14 DIAGNOSIS — Z87442 Personal history of urinary calculi: Secondary | ICD-10-CM | POA: Diagnosis not present

## 2014-02-14 LAB — POC URINE PREG, ED: Preg Test, Ur: NEGATIVE

## 2014-02-14 LAB — URINALYSIS, ROUTINE W REFLEX MICROSCOPIC
Bilirubin Urine: NEGATIVE
GLUCOSE, UA: NEGATIVE mg/dL
Hgb urine dipstick: NEGATIVE
KETONES UR: NEGATIVE mg/dL
Leukocytes, UA: NEGATIVE
Nitrite: NEGATIVE
PH: 5.5 (ref 5.0–8.0)
Protein, ur: NEGATIVE mg/dL
SPECIFIC GRAVITY, URINE: 1.029 (ref 1.005–1.030)
Urobilinogen, UA: 0.2 mg/dL (ref 0.0–1.0)

## 2014-02-14 LAB — WET PREP, GENITAL
TRICH WET PREP: NONE SEEN
YEAST WET PREP: NONE SEEN

## 2014-02-14 MED ORDER — IBUPROFEN 800 MG PO TABS
800.0000 mg | ORAL_TABLET | Freq: Once | ORAL | Status: AC
  Administered 2014-02-14: 800 mg via ORAL
  Filled 2014-02-14: qty 1

## 2014-02-14 MED ORDER — METRONIDAZOLE 500 MG PO TABS: 500.0000 mg | ORAL_TABLET | Freq: Two times a day (BID) | ORAL | Status: DC

## 2014-02-14 MED ORDER — METRONIDAZOLE 500 MG PO TABS
500.0000 mg | ORAL_TABLET | Freq: Once | ORAL | Status: AC
  Administered 2014-02-14: 500 mg via ORAL
  Filled 2014-02-14: qty 1

## 2014-02-14 MED ORDER — IBUPROFEN 800 MG PO TABS: 800.0000 mg | ORAL_TABLET | Freq: Three times a day (TID) | ORAL | Status: DC | PRN

## 2014-02-14 NOTE — ED Notes (Signed)
Patient transported to X-ray 

## 2014-02-14 NOTE — ED Notes (Signed)
Pt c/o vaginal discharge with foul odor and URI sx with productive cough

## 2014-02-14 NOTE — ED Provider Notes (Signed)
TIME SEEN: 3:50 PM  CHIEF COMPLAINT: Cough, sinus pressure, vaginal discharge  HPI: Patient is a 28 year old female with history of kidney stones, endometriosis, ovarian cysts who presents emergency department with complaints of several days of cough, sinus congestion, fever. No chest pain or shortness of breath.   She is also complaining of right flank pain, dysuria. No vaginal bleeding or discharge. No hematuria. She is status post hysterectomy, cholecystectomy and appendectomy. She reports she has had a history of STDs and PID in the past. She is sexually active with one partner.  ROS: See HPI Constitutional:  fever  Eyes: no drainage  ENT: no runny nose   Cardiovascular:  no chest pain  Resp: no SOB  GI: no vomiting GU: no dysuria Integumentary: no rash  Allergy: no hives  Musculoskeletal: no leg swelling  Neurological: no slurred speech ROS otherwise negative  PAST MEDICAL HISTORY/PAST SURGICAL HISTORY:  Past Medical History  Diagnosis Date  . Asthma   . Kidney stone   . Fibroid   . Endometriosis   . Ovarian cyst     MEDICATIONS:  Prior to Admission medications   Medication Sig Start Date End Date Taking? Authorizing Provider  albuterol (PROVENTIL HFA;VENTOLIN HFA) 108 (90 BASE) MCG/ACT inhaler Inhale 1-2 puffs into the lungs every 6 (six) hours as needed for wheezing.   Yes Historical Provider, MD  albuterol (PROVENTIL) (2.5 MG/3ML) 0.083% nebulizer solution Take 2.5 mg by nebulization every 6 (six) hours as needed for wheezing.   Yes Historical Provider, MD  Brompheniramine-Phenylephrine (COLD & ALLERGY PO) Take 1 tablet by mouth every 6 (six) hours as needed (fever/cough).   Yes Historical Provider, MD  diphenhydrAMINE (BENADRYL) 25 MG tablet Take 25 mg by mouth every 6 (six) hours as needed for allergies.   Yes Historical Provider, MD  ibuprofen (ADVIL,MOTRIN) 600 MG tablet Take 1 tablet (600 mg total) by mouth every 6 (six) hours as needed. 09/02/13  Yes Gwen Pounds, CNM  mometasone-formoterol (DULERA) 100-5 MCG/ACT AERO Inhale 2 puffs into the lungs 2 (two) times daily.   Yes Historical Provider, MD  Multiple Vitamins-Minerals (MULTIVITAMIN PO) Take 1 tablet by mouth daily.    Yes Historical Provider, MD    ALLERGIES:  Allergies  Allergen Reactions  . Aspirin Rash  . Latex Rash  . Morphine And Related Rash    SOCIAL HISTORY:  History  Substance Use Topics  . Smoking status: Never Smoker   . Smokeless tobacco: Never Used  . Alcohol Use: No    FAMILY HISTORY: History reviewed. No pertinent family history.  EXAM: BP 95/72  Pulse 78  Temp(Src) 98 F (36.7 C) (Oral)  Resp 18  SpO2 100% CONSTITUTIONAL: Alert and oriented and responds appropriately to questions. Well-appearing; well-nourished, smiling, pleasant, no distress HEAD: Normocephalic EYES: Conjunctivae clear, PERRL ENT: normal nose; no rhinorrhea; moist mucous membranes; pharynx without lesions noted NECK: Supple, no meningismus, no LAD  CARD: RRR; S1 and S2 appreciated; no murmurs, no clicks, no rubs, no gallops RESP: Normal chest excursion without splinting or tachypnea; breath sounds clear and equal bilaterally; no wheezes, no rhonchi, no rales,  ABD/GI: Normal bowel sounds; non-distended; soft, non-tender, no rebound, no guarding GU:  Normal external genitalia, I do not see a cervix present, she does have small polyps noted on her vaginal wall, minimal amount of thin white vaginal discharge, no adnexal tenderness or fullness, no vaginal bleeding BACK:  The back appears normal and is non-tender to palpation, there is no CVA tenderness  EXT: Normal ROM in all joints; non-tender to palpation; no edema; normal capillary refill; no cyanosis    SKIN: Normal color for age and race; warm NEURO: Moves all extremities equally PSYCH: The patient's mood and manner are appropriate. Grooming and personal hygiene are appropriate.  MEDICAL DECISION MAKING: Patient here with likely  viral URI, sinusitis. Chest x-ray shows no infiltrate. I do not feel she needs antibiotics at this time. She has no chest pain or shortness of breath. She is very pleasant, nontoxic-appearing, smiling and hemodynamically stable. She is complaining of vaginal discharge. She does have rare clue cells. We'll treat for possible bacterial vaginosis. Her pelvic exam is benign. Her abdominal exam is benign. Urine shows no blood or other sign of infection. I do not think this is a kidney stone, UTI or pyelonephritis. I feel she is safe to be discharged him. Have advised her to use Tylenol and Motrin for fever and pain. Suspect that her reported fevers at home may be secondary to her URI symptoms as her abdominal exam is very benign. She has not had ibuprofen in over 12 hours and is afebrile in the ED. Discussed strict return precautions. Patient verbalizes understanding and is comfortable with plan.      [place], DO 02/14/14 2338

## 2014-02-14 NOTE — Discharge Instructions (Signed)
Bacterial Vaginosis °Bacterial vaginosis is a vaginal infection that occurs when the normal balance of bacteria in the vagina is disrupted. It results from an overgrowth of certain bacteria. This is the most common vaginal infection in women of childbearing age. Treatment is important to prevent complications, especially in pregnant women, as it can cause a premature delivery. °CAUSES  °Bacterial vaginosis is caused by an increase in harmful bacteria that are normally present in smaller amounts in the vagina. Several different kinds of bacteria can cause bacterial vaginosis. However, the reason that the condition develops is not fully understood. °RISK FACTORS °Certain activities or behaviors can put you at an increased risk of developing bacterial vaginosis, including: °· Having a new sex partner or multiple sex partners. °· Douching. °· Using an intrauterine device (IUD) for contraception. °Women do not get bacterial vaginosis from toilet seats, bedding, swimming pools, or contact with objects around them. °SIGNS AND SYMPTOMS  °Some women with bacterial vaginosis have no signs or symptoms. Common symptoms include: °· Grey vaginal discharge. °· A fishlike odor with discharge, especially after sexual intercourse. °· Itching or burning of the vagina and vulva. °· Burning or pain with urination. °DIAGNOSIS  °Your health care provider will take a medical history and examine the vagina for signs of bacterial vaginosis. A sample of vaginal fluid may be taken. Your health care provider will look at this sample under a microscope to check for bacteria and abnormal cells. A vaginal pH test may also be done.  °TREATMENT  °Bacterial vaginosis may be treated with antibiotic medicines. These may be given in the form of a pill or a vaginal cream. A second round of antibiotics may be prescribed if the condition comes back after treatment.  °HOME CARE INSTRUCTIONS  °· Only take over-the-counter or prescription medicines as  directed by your health care provider. °· If antibiotic medicine was prescribed, take it as directed. Make sure you finish it even if you start to feel better. °· Do not have sex until treatment is completed. °· Tell all sexual partners that you have a vaginal infection. They should see their health care provider and be treated if they have problems, such as a mild rash or itching. °· Practice safe sex by using condoms and only having one sex partner. °SEEK MEDICAL CARE IF:  °· Your symptoms are not improving after 3 days of treatment. °· You have increased discharge or pain. °· You have a fever. °MAKE SURE YOU:  °· Understand these instructions. °· Will watch your condition. °· Will get help right away if you are not doing well or get worse. °FOR MORE INFORMATION  °Centers for Disease Control and Prevention, Division of STD Prevention: www.cdc.gov/std °American Sexual Health Association (ASHA): www.ashastd.org  °Document Released: 05/14/2005 Document Revised: 03/04/2013 Document Reviewed: 12/24/2012 °ExitCare® Patient Information ©2015 ExitCare, LLC. This information is not intended to replace advice given to you by your health care provider. Make sure you discuss any questions you have with your health care provider. ° °Upper Respiratory Infection, Adult °An upper respiratory infection (URI) is also sometimes known as the common cold. The upper respiratory tract includes the nose, sinuses, throat, trachea, and bronchi. Bronchi are the airways leading to the lungs. Most people improve within 1 week, but symptoms can last up to 2 weeks. A residual cough may last even longer.  °CAUSES °Many different viruses can infect the tissues lining the upper respiratory tract. The tissues become irritated and inflamed and often become very moist. Mucus   production is also common. A cold is contagious. You can easily spread the virus to others by oral contact. This includes kissing, sharing a glass, coughing, or sneezing.  Touching your mouth or nose and then touching a surface, which is then touched by another person, can also spread the virus. °SYMPTOMS  °Symptoms typically develop 1 to 3 days after you come in contact with a cold virus. Symptoms vary from person to person. They may include: °· Runny nose. °· Sneezing. °· Nasal congestion. °· Sinus irritation. °· Sore throat. °· Loss of voice (laryngitis). °· Cough. °· Fatigue. °· Muscle aches. °· Loss of appetite. °· Headache. °· Low-grade fever. °DIAGNOSIS  °You might diagnose your own cold based on familiar symptoms, since most people get a cold 2 to 3 times a year. Your caregiver can confirm this based on your exam. Most importantly, your caregiver can check that your symptoms are not due to another disease such as strep throat, sinusitis, pneumonia, asthma, or epiglottitis. Blood tests, throat tests, and X-rays are not necessary to diagnose a common cold, but they may sometimes be helpful in excluding other more serious diseases. Your caregiver will decide if any further tests are required. °RISKS AND COMPLICATIONS  °You may be at risk for a more severe case of the common cold if you smoke cigarettes, have chronic heart disease (such as heart failure) or lung disease (such as asthma), or if you have a weakened immune system. The very young and very old are also at risk for more serious infections. Bacterial sinusitis, middle ear infections, and bacterial pneumonia can complicate the common cold. The common cold can worsen asthma and chronic obstructive pulmonary disease (COPD). Sometimes, these complications can require emergency medical care and may be life-threatening. °PREVENTION  °The best way to protect against getting a cold is to practice good hygiene. Avoid oral or hand contact with people with cold symptoms. Wash your hands often if contact occurs. There is no clear evidence that vitamin C, vitamin E, echinacea, or exercise reduces the chance of developing a cold.  However, it is always recommended to get plenty of rest and practice good nutrition. °TREATMENT  °Treatment is directed at relieving symptoms. There is no cure. Antibiotics are not effective, because the infection is caused by a virus, not by bacteria. Treatment may include: °· Increased fluid intake. Sports drinks offer valuable electrolytes, sugars, and fluids. °· Breathing heated mist or steam (vaporizer or shower). °· Eating chicken soup or other clear broths, and maintaining good nutrition. °· Getting plenty of rest. °· Using gargles or lozenges for comfort. °· Controlling fevers with ibuprofen or acetaminophen as directed by your caregiver. °· Increasing usage of your inhaler if you have asthma. °Zinc gel and zinc lozenges, taken in the first 24 hours of the common cold, can shorten the duration and lessen the severity of symptoms. Pain medicines may help with fever, muscle aches, and throat pain. A variety of non-prescription medicines are available to treat congestion and runny nose. Your caregiver can make recommendations and may suggest nasal or lung inhalers for other symptoms.  °HOME CARE INSTRUCTIONS  °· Only take over-the-counter or prescription medicines for pain, discomfort, or fever as directed by your caregiver. °· Use a warm mist humidifier or inhale steam from a shower to increase air moisture. This may keep secretions moist and make it easier to breathe. °· Drink enough water and fluids to keep your urine clear or pale yellow. °· Rest as needed. °· Return to   work when your temperature has returned to normal or as your caregiver advises. You may need to stay home longer to avoid infecting others. You can also use a face mask and careful hand washing to prevent spread of the virus. °SEEK MEDICAL CARE IF:  °· After the first few days, you feel you are getting worse rather than better. °· You need your caregiver's advice about medicines to control symptoms. °· You develop chills, worsening shortness  of breath, or brown or red sputum. These may be signs of pneumonia. °· You develop yellow or brown nasal discharge or pain in the face, especially when you bend forward. These may be signs of sinusitis. °· You develop a fever, swollen neck glands, pain with swallowing, or white areas in the back of your throat. These may be signs of strep throat. °SEEK IMMEDIATE MEDICAL CARE IF:  °· You have a fever. °· You develop severe or persistent headache, ear pain, sinus pain, or chest pain. °· You develop wheezing, a prolonged cough, cough up blood, or have a change in your usual mucus (if you have chronic lung disease). °· You develop sore muscles or a stiff neck. °Document Released: 11/07/2000 Document Revised: 08/06/2011 Document Reviewed: 08/19/2013 °ExitCare® Patient Information ©2015 ExitCare, LLC. This information is not intended to replace advice given to you by your health care provider. Make sure you discuss any questions you have with your health care provider. ° °

## 2014-02-16 LAB — GC/CHLAMYDIA PROBE AMP
CT Probe RNA: NEGATIVE
GC Probe RNA: NEGATIVE

## 2014-03-24 ENCOUNTER — Emergency Department (HOSPITAL_COMMUNITY): Payer: Medicaid Other

## 2014-03-24 ENCOUNTER — Emergency Department (HOSPITAL_COMMUNITY)
Admission: EM | Admit: 2014-03-24 | Discharge: 2014-03-24 | Disposition: A | Discharge status: Home / Self Care | Payer: Medicaid Other | Attending: Emergency Medicine | Admitting: Emergency Medicine

## 2014-03-24 ENCOUNTER — Encounter (HOSPITAL_COMMUNITY): Payer: Self-pay | Admitting: Emergency Medicine

## 2014-03-24 DIAGNOSIS — J45909 Unspecified asthma, uncomplicated: Secondary | ICD-10-CM | POA: Diagnosis not present

## 2014-03-24 DIAGNOSIS — Z87442 Personal history of urinary calculi: Secondary | ICD-10-CM | POA: Insufficient documentation

## 2014-03-24 DIAGNOSIS — Z79899 Other long term (current) drug therapy: Secondary | ICD-10-CM | POA: Insufficient documentation

## 2014-03-24 DIAGNOSIS — Z9089 Acquired absence of other organs: Secondary | ICD-10-CM | POA: Diagnosis not present

## 2014-03-24 DIAGNOSIS — Z8742 Personal history of other diseases of the female genital tract: Secondary | ICD-10-CM | POA: Insufficient documentation

## 2014-03-24 DIAGNOSIS — Z90710 Acquired absence of both cervix and uterus: Secondary | ICD-10-CM | POA: Insufficient documentation

## 2014-03-24 DIAGNOSIS — Z91040 Latex allergy status: Secondary | ICD-10-CM | POA: Insufficient documentation

## 2014-03-24 DIAGNOSIS — Z3202 Encounter for pregnancy test, result negative: Secondary | ICD-10-CM | POA: Diagnosis not present

## 2014-03-24 DIAGNOSIS — R112 Nausea with vomiting, unspecified: Secondary | ICD-10-CM

## 2014-03-24 DIAGNOSIS — Z792 Long term (current) use of antibiotics: Secondary | ICD-10-CM | POA: Insufficient documentation

## 2014-03-24 DIAGNOSIS — R1012 Left upper quadrant pain: Secondary | ICD-10-CM | POA: Diagnosis not present

## 2014-03-24 DIAGNOSIS — R109 Unspecified abdominal pain: Secondary | ICD-10-CM

## 2014-03-24 LAB — CBC WITH DIFFERENTIAL/PLATELET
BASOS ABS: 0.1 10*3/uL (ref 0.0–0.1)
Basophils Relative: 1 % (ref 0–1)
EOS PCT: 0 % (ref 0–5)
Eosinophils Absolute: 0 10*3/uL (ref 0.0–0.7)
HCT: 45.6 % (ref 36.0–46.0)
Hemoglobin: 15.6 g/dL — ABNORMAL HIGH (ref 12.0–15.0)
Lymphocytes Relative: 28 % (ref 12–46)
Lymphs Abs: 2.4 10*3/uL (ref 0.7–4.0)
MCH: 31.5 pg (ref 26.0–34.0)
MCHC: 34.2 g/dL (ref 30.0–36.0)
MCV: 91.9 fL (ref 78.0–100.0)
MONO ABS: 0.6 10*3/uL (ref 0.1–1.0)
MONOS PCT: 7 % (ref 3–12)
Neutro Abs: 5.4 10*3/uL (ref 1.7–7.7)
Neutrophils Relative %: 64 % (ref 43–77)
Platelets: 227 10*3/uL (ref 150–400)
RBC: 4.96 MIL/uL (ref 3.87–5.11)
RDW: 12.9 % (ref 11.5–15.5)
WBC: 8.4 10*3/uL (ref 4.0–10.5)

## 2014-03-24 LAB — COMPREHENSIVE METABOLIC PANEL
ALBUMIN: 4.5 g/dL (ref 3.5–5.2)
ALT: 12 U/L (ref 0–35)
AST: 23 U/L (ref 0–37)
Alkaline Phosphatase: 61 U/L (ref 39–117)
Anion gap: 16 — ABNORMAL HIGH (ref 5–15)
BUN: 19 mg/dL (ref 6–23)
CALCIUM: 9.6 mg/dL (ref 8.4–10.5)
CO2: 22 mEq/L (ref 19–32)
Chloride: 104 mEq/L (ref 96–112)
Creatinine, Ser: 1 mg/dL (ref 0.50–1.10)
GFR calc Af Amer: 89 mL/min — ABNORMAL LOW (ref >90)
GFR, EST NON AFRICAN AMERICAN: 77 mL/min — AB (ref >90)
Glucose, Bld: 86 mg/dL (ref 70–99)
Potassium: 4.1 mEq/L (ref 3.7–5.3)
Sodium: 142 mEq/L (ref 137–147)
Total Bilirubin: 1 mg/dL (ref 0.3–1.2)
Total Protein: 7.4 g/dL (ref 6.0–8.3)

## 2014-03-24 LAB — URINALYSIS, ROUTINE W REFLEX MICROSCOPIC
GLUCOSE, UA: NEGATIVE mg/dL
Hgb urine dipstick: NEGATIVE
Ketones, ur: >80 mg/dL — AB
LEUKOCYTES UA: NEGATIVE
NITRITE: NEGATIVE
PH: 5.5 (ref 5.0–8.0)
PROTEIN: NEGATIVE mg/dL
Specific Gravity, Urine: 1.03 (ref 1.005–1.030)
Urobilinogen, UA: 0.2 mg/dL (ref 0.0–1.0)

## 2014-03-24 LAB — LIPASE, BLOOD: Lipase: 19 U/L (ref 11–59)

## 2014-03-24 LAB — PREGNANCY, URINE: Preg Test, Ur: NEGATIVE

## 2014-03-24 MED ORDER — TRAMADOL HCL 50 MG PO TABS
50.0000 mg | ORAL_TABLET | Freq: Once | ORAL | Status: AC
  Administered 2014-03-24: 50 mg via ORAL
  Filled 2014-03-24: qty 1

## 2014-03-24 MED ORDER — ONDANSETRON HCL 4 MG PO TABS: 4.0000 mg | ORAL_TABLET | Freq: Four times a day (QID) | ORAL | Status: DC

## 2014-03-24 MED ORDER — ONDANSETRON HCL 4 MG/2ML IJ SOLN
4.0000 mg | Freq: Once | INTRAMUSCULAR | Status: AC
  Administered 2014-03-24: 4 mg via INTRAVENOUS
  Filled 2014-03-24: qty 2

## 2014-03-24 MED ORDER — ONDANSETRON 4 MG PO TBDP
8.0000 mg | ORAL_TABLET | Freq: Once | ORAL | Status: AC
  Administered 2014-03-24: 8 mg via ORAL
  Filled 2014-03-24: qty 2

## 2014-03-24 MED ORDER — SODIUM CHLORIDE 0.9 % IV BOLUS (SEPSIS)
1000.0000 mL | Freq: Once | INTRAVENOUS | Status: AC
  Administered 2014-03-24: 1000 mL via INTRAVENOUS

## 2014-03-24 MED ORDER — FENTANYL CITRATE 0.05 MG/ML IJ SOLN
50.0000 ug | Freq: Once | INTRAMUSCULAR | Status: AC
  Administered 2014-03-24: 50 ug via NASAL

## 2014-03-24 MED ORDER — FENTANYL CITRATE 0.05 MG/ML IJ SOLN
INTRAMUSCULAR | Status: AC
  Filled 2014-03-24: qty 2

## 2014-03-24 MED ORDER — TRAMADOL HCL 50 MG PO TABS: 50.0000 mg | ORAL_TABLET | Freq: Four times a day (QID) | ORAL | Status: DC | PRN

## 2014-03-24 NOTE — Discharge Instructions (Signed)
For pain control you may take:   acetaminophen 975mg  (this is 3 over the counter pills) four times a day. Do not drink alcohol or combine with other medications that have acetaminophen as an ingredient (Read the labels!).  For breakthrough pain you may take Tramadol. Do not drink alcohol drive or operate heavy machinery when taking Tramadol.  Push fluids: take small frequent sips of water or Gatorade, do not drink any soda, juice or caffeinated beverages.    Slowly resume solid diet as desired. Avoid food that are spicy, contain dairy and/or have high fat content.  Aviod NSAIDs (aspirin, motrin, ibuprofen, naproxen, Aleve et Ronney Asters) for pain control because they will irritate your stomach.  Return to the emergency room for severely worsening abdominal pain, abdominal pain that localizes to a particular area (especially the right lower part of the belly), pain that persists past 8-10 hours, blood in stool or vomit, severe weakness, fainting, or fever.   Please obtain primary care using resource guide below. But the minute you were seen in the emergency room and that they will need to obtain records for further outpatient management.     Emergency Department Resource Guide 1) Find a Doctor and Pay Out of Pocket Although you won't have to find out who is covered by your insurance plan, it is a good idea to ask around and get recommendations. You will then need to call the office and see if the doctor you have chosen will accept you as a new patient and what types of options they offer for patients who are self-pay. Some doctors offer discounts or will set up payment plans for their patients who do not have insurance, but you will need to ask so you aren't surprised when you get to your appointment.  2) Contact Your Local Health Department Not all health departments have doctors that can see patients for sick visits, but many do, so it is worth a call to see if yours does. If you don't know where  your local health department is, you can check in your phone book. The CDC also has a tool to help you locate your state's health department, and many state websites also have listings of all of their local health departments.  3) Find a [facility] If your illness is not likely to be very severe or complicated, you may want to try a walk in clinic. These are popping up all over the country in pharmacies, drugstores, and shopping centers. They're usually staffed by nurse practitioners or physician assistants that have been trained to treat common illnesses and complaints. They're usually fairly quick and inexpensive. However, if you have serious medical issues or chronic medical problems, these are probably not your best option.  No Primary Care Doctor: - Call Health Connect at  [phone] - they can help you locate a primary care doctor that  accepts your insurance, provides certain services, etc. - Physician Referral Service- [phone]  Chronic Pain Problems: Organization         Address  Phone   Notes  [facility]  [phone] Patients need to be referred by their primary care doctor.   Medication Assistance: Organization         Address  Phone   Notes  [facility] Medication [facility] [address] [place], [place] [address] [phone] --Must be a resident of [facility] -- Must have NO insurance coverage whatsoever (no Medicaid/ Medicare, etc.) -- The pt.  MUST have a primary care doctor that directs their care regularly and follows them in the community   [facility]ist  [phone]   [employer]  [phone]    Agencies that provide inexpensive medical care: Organization         Address  Phone   Notes  [place]  [phone]   [facility]    [phone]   [facility] [place], [place] [address] [phone]   [place] [address], [place] [phone]   Planned Parenthood    [phone]   [facility]    [phone]   [place] and [place] [address], [place] Phone:  [phone], Fax:  [phone] Hours of Operation:  9 am - 6 pm, M-F.  Also accepts Medicaid/Medicare and self-pay.  [facility] for [place], [address], [place] Phone: [phone], Fax: [phone]. Hours of Operation:  8:30 am - 5:30 pm, M-F.  Also accepts Medicaid and self-pay.  [facility] [place] [address], [place] Phone: [phone]   [place], [place], [place] [phone], Ext. [phone] Mondays & Thursdays: 7-9 AM.  First 15 patients are seen on a first come, first serve basis.    [place] Providers:  Organization         Address  Phone   Notes  [facility] [address], [place] [phone] Also accepts self-pay patients.  [facility] [address], [place]  [phone]   [place], [address], [place] [phone]   [facility] [address] [place] [phone]   Lucianne Lei [address], [place]   [phone] Only accepts [place] Access [place] patients after they have their name applied to their card.   Self-Pay (no insurance) in [facility]:  Organization         Address  Phone   Notes  Sickle Cell Patients, [facility] Internal [facility] [place] [phone]   [facility] [place] [phone]   [facility] [place]  [place], [place], [place] [phone]   [facility]/Dr. Osei-Bonsu  [address], [place] or [address], [place] [phone] Phone number for both [place] and [place] locations is the same.  [facility] [address], [place] [phone]   [facility] [address] [place] or [address] [phone]   [facility] [address], [place] [phone], phone; [phone], fax Sees patients 1st and 3rd Saturday of every month.  Must not qualify for public or private insurance (i.e. Medicaid, Medicare, [place] Health Choice, Veterans' Benefits)  Household income should be no more than 200% of the poverty level The clinic cannot treat you if you are pregnant or think you are pregnant  Sexually transmitted diseases are not treated at the clinic.    Dental Care: Organization         Address  Phone  Notes  [facility] [address] [place] [address]  Mardene Speak [phone] Accepts children up to age 10 who are enrolled in Medicaid or [place]; pregnant women with a Medicaid card; and children who have applied for Medicaid or [place] Health Choice, but were declined, whose parents can pay a reduced fee at time of service.  [facility] Department of [facility]  [address], [place] [phone] Accepts children up to age 28 who are enrolled in [place] or [place]; pregnant women with a Medicaid card; and children who have applied for Medicaid or [place] Health Choice, but were declined, whose parents can pay a reduced fee at time of service.  [place] Adult [facility] Access PROGRAM  [place] [phone] Patients are seen by appointment only. Walk-ins are not accepted. [place] will see patients 30 years of age and older. Monday - Tuesday (8am-5pm) Most Wednesdays (8:30-5pm) $30 per visit, cash only  [facility] Adult Dental Access PROGRAM  [address], [facility] [phone] Patients are seen by appointment only. Walk-ins are not  accepted. [place] will see patients 69 years of age and older. One Wednesday Evening (Monthly: Volunteer Based).  $30 per visit, cash only  [place]  [phone] for adults; Children under age 91, call Graduate Pediatric Dentistry at [phone]. Children aged 43-14, please call [phone] to request a pediatric application.  Dental services are provided in all areas of dental care including fillings, crowns and bridges, complete and partial dentures, implants, gum treatment, root canals, and extractions. Preventive care is also provided. Treatment is provided to both adults and children. Patients are selected via a lottery and there is often a waiting list.   [facility] [address], [place]  [phone] [email or web address]   [facility] [address] [place], [place] [phone], Ext. [phone] Second and Fourth Thursday of each month, opens at 6:30 AM; Clinic ends at 9 AM.  Patients are seen on a first-come first-served basis, and a limited number are seen during each clinic.   [facility]  [address] Hillard Danker [place], [place] [phone]   Eligibility Requirements You must have lived in [place], [place], or [place] counties for at least the last three months.   You cannot be eligible for state or federal sponsored [employer], including [employer], [place], or [employer].   You generally cannot be eligible for healthcare insurance through your employer.    How to apply: Eligibility screenings are held every Tuesday and Wednesday afternoon from 1:00 pm until 4:00 pm. You do not need an appointment for the interview!  [facility] [address] [place], [place]   [place]  [place] Department  [place]  [phone]    Behavioral Health Resources in the  Community: Intensive Outpatient Programs Organization         Address  Phone  Notes  [place]. [address] [place], [place] [phone]   [facility] Outpatient [address] [place], [place]   ADS: Alcohol & Drug S[facility]cs [address], [place], [place]   [place] [address]  [place], [place] or [phone]   Substance Abuse Resources Organization         Address  Phone  Notes  Alcohol and Drug  Services  [place]  [phone]   The [place]   Chinita Pester  [phone]   Residential & Outpatient Substance Abuse Program  [phone]   Psychological Services Organization         Address  Phone  Notes  [place]  [place]  [phone]   [place] [address], [place] or [phone]    Mobile Crisis Teams Organization         Address  Phone  Notes  [facility], Mobile Crisis Care Unit  [phone]   [facility]  [address] [place], [place]   [place] Levels [address] [place] [phone]    Self-Help/Support Groups Organization         Address  Phone             Notes  [place]. of [place] - variety of support groups  [place] Call for more information  Narcotics Anonymous (NA), [facility] [address], [employer] [place]  2 meetings at this location   Special educational needs teacher         Address  Phone  Notes  [facility] Residential Treatment [place],    [place]  [phone]   [facility]  [address], [place] [id number], [place], [place]   [place], [place] [phone] Admissions: 8am-3pm M-F  Incentives Substance [place] [phone],    [place], [place] [phone]   [facility] [address] [place] [address] [place], [place], [place]   [facility] [address],  [place], [place]   [facility] - Intensive Outpatient [address] Kristeen Mans [address], [place], [place]   [facility] ([place].,  [place], [place] [phone] or [phone]   [facility] (RTS) [address], [place], [place] Accepts Medicaid  [place] [address],  [place] [phone] Substance Abuse/Addiction Treatment   [facility] Organization         Address  Phone  Notes  [facility]  [phone]   Domenic Schwab, PhD [address] Arlis Porta [place], [place]   [phone] or [phone]   [place], [place] [phone]   [facility] [address] [place], [place] [phone] Insurance/Medicaid/sponsorship through [facility] and Families [address], [address]                                    [place], [place] [phone] [place] [employer] [address], [place] [phone]    Dr. Adele Schilder  [phone]   [facility] Dept. 1) [address] [place] 2) [place] 3)  [place] [address], [place] [phone]  [phone]   [place] [phone] or [phone] (After Hours)

## 2014-03-24 NOTE — ED Notes (Signed)
Pt to u/s

## 2014-03-24 NOTE — ED Provider Notes (Signed)
Medical screening examination/treatment/procedure(s) were performed by non-physician practitioner and as supervising physician I was immediately available for consultation/collaboration.   EKG Interpretation None        Hoy Morn, MD 03/24/14 1801

## 2014-03-24 NOTE — ED Provider Notes (Signed)
CSN: [id number]     Arrival date & time 03/24/14  1333 History   First MD Initiated Contact with Patient 03/24/14 1516     Chief Complaint  Patient presents with  . Abdominal Pain  . Emesis     (Consider location/radiation/quality/duration/timing/severity/associated sxs/prior Treatment) HPI  Jane Hanson is a 28 y.o. female with past medical history significant for kidney stones, asthma, endometriosis complaining of acute onset of left upper quadrant abdominal pain, rated a 10 out of 10 radiating to the left back at 1 PM today. She has had 3 episodes of nonbloody, nonbilious, none coffee-ground appearance emesis since that time. She denies fever, chills, chest pain, cough, shortness of breath, change in bowel habits, dysuria, hematuria, concentrated or foul-smelling urine. She endorses sensation of incomplete voiding on review of systems. She took ibuprofen at home and was given triage initiated fentanyl with little relief. States that this does not feel like prior episodes of kidney stones.  Past Medical History  Diagnosis Date  . Asthma   . Kidney stone   . Fibroid   . Endometriosis   . Ovarian cyst    Past Surgical History  Procedure Laterality Date  . Cholecystectomy    . Appendectomy    . Abdominal hysterectomy  2014   History reviewed. No pertinent family history. History  Substance Use Topics  . Smoking status: Never Smoker   . Smokeless tobacco: Never Used  . Alcohol Use: No   OB History   Grav Para Term Preterm Abortions TAB SAB Ect Mult Living   3 3 2 1      3      Review of Systems  10 systems reviewed and found to be negative, except as noted in the HPI.   Allergies  Aspirin; Latex; and Morphine and related  Home Medications   Prior to Admission medications   Medication Sig Start Date End Date Taking? Authorizing Provider  albuterol (PROVENTIL HFA;VENTOLIN HFA) 108 (90 BASE) MCG/ACT inhaler Inhale 1-2 puffs into the lungs every 6 (six) hours as  needed for wheezing.   Yes Historical Provider, MD  albuterol (PROVENTIL) (2.5 MG/3ML) 0.083% nebulizer solution Take 2.5 mg by nebulization every 6 (six) hours as needed for wheezing.   Yes Historical Provider, MD  ibuprofen (ADVIL,MOTRIN) 200 MG tablet Take 200 mg by mouth every 6 (six) hours as needed.   Yes Historical Provider, MD  ibuprofen (ADVIL,MOTRIN) 200 MG tablet Take 400 mg by mouth every 6 (six) hours as needed for headache or mild pain.   Yes Historical Provider, MD  metroNIDAZOLE (FLAGYL) 500 MG tablet Take 1 tablet (500 mg total) by mouth 2 (two) times daily. Do not drink alcohol with this medication. 02/14/14  Yes Kristen N Ward, DO  mometasone-formoterol (DULERA) 100-5 MCG/ACT AERO Inhale 2 puffs into the lungs daily.    Yes Historical Provider, MD  ondansetron (ZOFRAN) 4 MG tablet Take 1 tablet (4 mg total) by mouth every 6 (six) hours. 03/24/14   Kimiya Brunelle, PA-C  traMADol (ULTRAM) 50 MG tablet Take 1 tablet (50 mg total) by mouth every 6 (six) hours as needed. 03/24/14   Kadedra Vanaken, PA-C   BP 115/64  Pulse 85  Temp(Src) 98.4 F (36.9 C) (Oral)  Resp 15  SpO2 98% Physical Exam  Nursing note and vitals reviewed. Constitutional: She is oriented to person, place, and time. She appears well-developed and well-nourished. No distress.  Well appearing, listening to music playing from phone and watching TV.   HENT:  Head: Normocephalic and atraumatic.  Eyes: Conjunctivae and EOM are normal. Pupils are equal, round, and reactive to light.  Neck: Normal range of motion.  Cardiovascular: Normal rate, regular rhythm and intact distal pulses.   Pulmonary/Chest: Effort normal and breath sounds normal. No stridor. No respiratory distress. She has no wheezes. She has no rales. She exhibits no tenderness.  Abdominal: Soft. Bowel sounds are normal. She exhibits no distension and no mass. There is tenderness. There is no rebound and no guarding.  Mild tenderness to deep  palpation in the left upper quadrant with no guarding or rebound  Genitourinary:  No CVA tenderness to palpation bilaterally  Musculoskeletal: Normal range of motion.  Neurological: She is alert and oriented to person, place, and time.  Psychiatric: She has a normal mood and affect.    ED Course  Procedures (including critical care time) Labs Review Labs Reviewed  CBC WITH DIFFERENTIAL - Abnormal; Notable for the following:    Hemoglobin 15.6 (*)    All other components within normal limits  COMPREHENSIVE METABOLIC PANEL - Abnormal; Notable for the following:    GFR calc non Af Amer 77 (*)    GFR calc Af Amer 89 (*)    Anion gap 16 (*)    All other components within normal limits  URINALYSIS, ROUTINE W REFLEX MICROSCOPIC - Abnormal; Notable for the following:    Color, Urine AMBER (*)    APPearance HAZY (*)    Bilirubin Urine SMALL (*)    Ketones, ur >80 (*)    All other components within normal limits  LIPASE, BLOOD  PREGNANCY, URINE  POC URINE PREG, ED    Imaging Review US Renal  03/24/2014   CLINICAL DATA:  Left flank pain.  EXAM: RENAL/URINARY TRACT ULTRASOUND COMPLETE  COMPARISON:  None.  FINDINGS: Right Kidney:  Length: 10.4 cm. Echogenicity within normal limits. No mass or hydronephrosis visualized.  Left Kidney:  Length: 10.5 cm. Echogenicity within normal limits. No mass or hydronephrosis visualized.  Bladder:  Appears normal for degree of bladder distention.  IMPRESSION: Normal exam.   Electronically Signed   By: Marcello Moores  Register   On: 03/24/2014 16:55     EKG Interpretation None      MDM   Final diagnoses:  Left flank pain  Non-intractable vomiting with nausea, vomiting of unspecified type  LUQ pain    Filed Vitals:   03/24/14 1342 03/24/14 1739  BP: 122/88 115/64  Pulse: 95 85  Temp: 98 F (36.7 C) 98.4 F (36.9 C)  TempSrc: Oral Oral  Resp: 20 15  SpO2: 97% 98%    Medications  fentaNYL (SUBLIMAZE) 0.05 MG/ML injection (not administered)   ondansetron (ZOFRAN-ODT) disintegrating tablet 8 mg (8 mg Oral Given 03/24/14 1346)  fentaNYL (SUBLIMAZE) injection 50 mcg (50 mcg Nasal Given 03/24/14 1347)  sodium chloride 0.9 % bolus 1,000 mL (0 mLs Intravenous Stopped 03/24/14 1713)  ondansetron (ZOFRAN) injection 4 mg (4 mg Intravenous Given 03/24/14 1617)  traMADol (ULTRAM) tablet 50 mg (50 mg Oral Given 03/24/14 1712)    Jane Hanson is a 28 y.o. female presenting with acute onset of left upper quadrant radiating to the left flank several episodes of nausea and vomiting. Vital signs without abnormality. Abdominal exam is benign. No CVA tenderness to palpation. Patient has history of kidney stones but I doubt that is the cause of her pain, she looks far to comfortable. Plan his blood work, UA, pain medication and renal ultrasound.  Serial abdominal exams remained benign  with a very mild tenderness outpatient in the left upper quadrant. Ultrasound with no hydronephrosis. No significant abnormality on blood work. Urinalysis does appear to be infected however she has a significant amount of ketones. On further questioning of the patient she states that she's been eating and drinking normally. Blood glucoses and normal. I've encouraged patient to maintain aggressive hydration.   Patient has past by mouth challenge. Appropriate for an amenable to discharge at this time. We've had an extensive discussion of return precautions.  Evaluation does not show pathology that would require ongoing emergent intervention or inpatient treatment. Pt is hemodynamically stable and mentating appropriately. Discussed findings and plan with patient/guardian, who agrees with care plan. All questions answered. Return precautions discussed and outpatient follow up given.   New Prescriptions   ONDANSETRON (ZOFRAN) 4 MG TABLET    Take 1 tablet (4 mg total) by mouth every 6 (six) hours.   TRAMADOL (ULTRAM) 50 MG TABLET    Take 1 tablet (50 mg total) by mouth every 6  (six) hours as needed.         Monico Blitz, PA-C 03/24/14 1751

## 2014-03-24 NOTE — ED Notes (Signed)
Pt drinking sprite  

## 2014-03-24 NOTE — ED Notes (Signed)
Pt reports onset one hour ago of left side abd pain, n/v/d and pain radiates around to her back. Mild difficulty urinating and hx of kidney stones. Pt tearful at triage.

## 2014-03-28 DIAGNOSIS — F209 Schizophrenia, unspecified: Secondary | ICD-10-CM

## 2014-03-28 DIAGNOSIS — F4310 Post-traumatic stress disorder, unspecified: Secondary | ICD-10-CM

## 2014-03-28 DIAGNOSIS — F3181 Bipolar II disorder: Secondary | ICD-10-CM

## 2014-03-28 HISTORY — DX: Post-traumatic stress disorder, unspecified: F43.10

## 2014-03-28 HISTORY — DX: Schizophrenia, unspecified: F20.9

## 2014-03-28 HISTORY — DX: Bipolar II disorder: F31.81

## 2014-03-29 ENCOUNTER — Encounter (HOSPITAL_COMMUNITY): Payer: Self-pay | Admitting: Emergency Medicine

## 2014-04-27 ENCOUNTER — Emergency Department (HOSPITAL_COMMUNITY)
Admission: EM | Admit: 2014-04-27 | Discharge: 2014-04-27 | Disposition: A | Discharge status: Home / Self Care | Payer: Medicaid Other | Attending: Emergency Medicine | Admitting: Emergency Medicine

## 2014-04-27 ENCOUNTER — Encounter (HOSPITAL_COMMUNITY): Payer: Self-pay | Admitting: Emergency Medicine

## 2014-04-27 DIAGNOSIS — R0981 Nasal congestion: Secondary | ICD-10-CM | POA: Insufficient documentation

## 2014-04-27 DIAGNOSIS — Z8542 Personal history of malignant neoplasm of other parts of uterus: Secondary | ICD-10-CM | POA: Insufficient documentation

## 2014-04-27 DIAGNOSIS — N390 Urinary tract infection, site not specified: Secondary | ICD-10-CM | POA: Insufficient documentation

## 2014-04-27 DIAGNOSIS — N842 Polyp of vagina: Secondary | ICD-10-CM | POA: Insufficient documentation

## 2014-04-27 DIAGNOSIS — Z3202 Encounter for pregnancy test, result negative: Secondary | ICD-10-CM | POA: Diagnosis not present

## 2014-04-27 DIAGNOSIS — Z87442 Personal history of urinary calculi: Secondary | ICD-10-CM | POA: Insufficient documentation

## 2014-04-27 DIAGNOSIS — Z91040 Latex allergy status: Secondary | ICD-10-CM | POA: Diagnosis not present

## 2014-04-27 DIAGNOSIS — Z79899 Other long term (current) drug therapy: Secondary | ICD-10-CM | POA: Diagnosis not present

## 2014-04-27 DIAGNOSIS — J45909 Unspecified asthma, uncomplicated: Secondary | ICD-10-CM | POA: Insufficient documentation

## 2014-04-27 DIAGNOSIS — N898 Other specified noninflammatory disorders of vagina: Secondary | ICD-10-CM | POA: Diagnosis present

## 2014-04-27 LAB — URINE MICROSCOPIC-ADD ON

## 2014-04-27 LAB — WET PREP, GENITAL
Clue Cells Wet Prep HPF POC: NONE SEEN
Trich, Wet Prep: NONE SEEN
WBC, Wet Prep HPF POC: NONE SEEN
Yeast Wet Prep HPF POC: NONE SEEN

## 2014-04-27 LAB — URINALYSIS, ROUTINE W REFLEX MICROSCOPIC
Bilirubin Urine: NEGATIVE
GLUCOSE, UA: NEGATIVE mg/dL
Hgb urine dipstick: NEGATIVE
Ketones, ur: NEGATIVE mg/dL
Nitrite: POSITIVE — AB
PH: 5 (ref 5.0–8.0)
PROTEIN: NEGATIVE mg/dL
Specific Gravity, Urine: 1.018 (ref 1.005–1.030)
Urobilinogen, UA: 0.2 mg/dL (ref 0.0–1.0)

## 2014-04-27 LAB — POC URINE PREG, ED: PREG TEST UR: NEGATIVE

## 2014-04-27 MED ORDER — CEPHALEXIN 500 MG PO CAPS: 500.0000 mg | ORAL_CAPSULE | Freq: Two times a day (BID) | ORAL | Status: DC

## 2014-04-27 NOTE — Discharge Instructions (Signed)
Return to the emergency room with worsening of symptoms, new symptoms or with symptoms that are concerning, especially abdominal pain, fevers, nausea, unable to tolerate fluids or back pain. Please take all of your antibiotics until finished!   You may develop abdominal discomfort or diarrhea from the antibiotic.  You may help offset this with probiotics which you can buy or get in yogurt. Do not eat  or take the probiotics until 2 hours after your antibiotic.  [facility]up with [facility] for your vaginal polyps.

## 2014-04-27 NOTE — ED Notes (Signed)
Itching and burning and white discharge with fishy odor. Also reports congested with drainage

## 2014-04-27 NOTE — ED Provider Notes (Signed)
CSN: [id number]     Arrival date & time 04/27/14  1914 History   First MD Initiated Contact with Patient 04/27/14 1134     Chief Complaint  Patient presents with  . Vaginal Discharge  . Nasal Congestion     (Consider location/radiation/quality/duration/timing/severity/associated sxs/prior Treatment) HPI  Jane Hanson is a 28 y.o. female with PMH of endometriosis, fibroids, abdominal hysterectomy, cholecystectomy, appendectomy presenting with increased white thin vaginal discharge with fishy odor. Patient states she had bacterial vaginosis 2 weeks ago and was treated with Flagyl. This feels very similar. She denies any abdominal pain, nausea, vomiting, diarrhea. No back pain. Patient does not have menstrual periods. She endorses pain with intercourse. No history of STDs. She also endorses frequency, foul smell to urine and intermittent dysuria. She denies fevers, chills. Patient also endorses nasal congestion with nasal discharge that is mildly thick for 6 days. Patient endorses rhinorrhea as well. No fevers, cough, chest pain, shortness of breath, difficulty breathing or sore throat. Patient is not taken anything for this.   Past Medical History  Diagnosis Date  . Asthma   . Kidney stone   . Fibroid   . Endometriosis   . Ovarian cyst    Past Surgical History  Procedure Laterality Date  . Cholecystectomy    . Appendectomy    . Abdominal hysterectomy  2014   History reviewed. No pertinent family history. History  Substance Use Topics  . Smoking status: Never Smoker   . Smokeless tobacco: Never Used  . Alcohol Use: No   OB History    Gravida Para Term Preterm AB TAB SAB Ectopic Multiple Living   3 3 2 1      3      Review of Systems  Constitutional: Negative for fever and chills.  HENT: Positive for congestion and rhinorrhea.   Respiratory: Negative for cough and shortness of breath.   Cardiovascular: Negative for chest pain.  Gastrointestinal: Negative for nausea,  vomiting, abdominal pain and diarrhea.  Genitourinary: Positive for dysuria. Negative for hematuria.  Musculoskeletal: Negative for back pain.  Neurological: Negative for weakness and headaches.      Allergies  Aspirin; Latex; and Morphine and related  Home Medications   Prior to Admission medications   Medication Sig Start Date End Date Taking? Authorizing Provider  albuterol (PROVENTIL HFA;VENTOLIN HFA) 108 (90 BASE) MCG/ACT inhaler Inhale 1-2 puffs into the lungs every 6 (six) hours as needed for wheezing.   Yes Historical Provider, MD  ibuprofen (ADVIL,MOTRIN) 200 MG tablet Take 400 mg by mouth every 6 (six) hours as needed for headache or mild pain.   Yes Historical Provider, MD  mometasone-formoterol (DULERA) 100-5 MCG/ACT AERO Inhale 2 puffs into the lungs daily.    Yes Historical Provider, MD  traMADol (ULTRAM) 50 MG tablet Take 1 tablet (50 mg total) by mouth every 6 (six) hours as needed. Patient taking differently: Take 50 mg by mouth every 6 (six) hours as needed (pain).  03/24/14  Yes Nicole Pisciotta, PA-C  albuterol (PROVENTIL) (2.5 MG/3ML) 0.083% nebulizer solution Take 2.5 mg by nebulization every 6 (six) hours as needed for wheezing.    Historical Provider, MD  cephALEXin (KEFLEX) 500 MG capsule Take 1 capsule (500 mg total) by mouth 2 (two) times daily. 04/27/14   Pura Spice, PA-C  metroNIDAZOLE (FLAGYL) 500 MG tablet Take 1 tablet (500 mg total) by mouth 2 (two) times daily. Do not drink alcohol with this medication. Patient not taking: Reported on 04/27/2014 02/14/14  Kristen N Ward, DO  ondansetron (ZOFRAN) 4 MG tablet Take 1 tablet (4 mg total) by mouth every 6 (six) hours. Patient not taking: Reported on 04/27/2014 03/24/14   Elmyra Ricks Pisciotta, PA-C   BP 106/63 mmHg  Pulse 87  Temp(Src) 98.1 F (36.7 C) (Oral)  Resp 18  SpO2 99% Physical Exam  Constitutional: She appears well-developed and well-nourished. No distress.  HENT:  Head: Normocephalic and  atraumatic.  Nose: Right sinus exhibits no maxillary sinus tenderness and no frontal sinus tenderness. Left sinus exhibits no maxillary sinus tenderness and no frontal sinus tenderness.  Mouth/Throat: Mucous membranes are normal. Posterior oropharyngeal erythema present. No oropharyngeal exudate or posterior oropharyngeal edema.  Eyes: Conjunctivae and EOM are normal. Right eye exhibits no discharge. Left eye exhibits no discharge.  Neck: Normal range of motion. Neck supple.  Cardiovascular: Normal rate, regular rhythm and normal heart sounds.   Pulmonary/Chest: Effort normal and breath sounds normal. No stridor. No respiratory distress. She has no wheezes. She has no rales.  Abdominal: Soft. Bowel sounds are normal. She exhibits no distension. There is no tenderness.  Genitourinary:   No cervix present. Pt with vaginal polyps that are tender to palpation worse on right. Visualized polyps without evidence of infection. No right or left adnexal tenderness. Minimal amt of thin white discharge with mild foul odor. No blood. Nurse in room for exam.   Lymphadenopathy:    She has no cervical adenopathy.  Neurological: She is alert. She exhibits normal muscle tone. Coordination normal.  Skin: Skin is warm and dry. She is not diaphoretic.  Nursing note and vitals reviewed.   ED Course  Procedures (including critical care time) Labs Review Labs Reviewed  URINALYSIS, ROUTINE W REFLEX MICROSCOPIC - Abnormal; Notable for the following:    APPearance CLOUDY (*)    Nitrite POSITIVE (*)    Leukocytes, UA SMALL (*)    All other components within normal limits  URINE MICROSCOPIC-ADD ON - Abnormal; Notable for the following:    Bacteria, UA MANY (*)    All other components within normal limits  WET PREP, GENITAL  GC/CHLAMYDIA PROBE AMP  POC URINE PREG, ED    Imaging Review No results found.   EKG Interpretation None      MDM   Final diagnoses:  UTI (lower urinary tract infection)   Vaginal polyp   Pt has been diagnosed with a UTI. Pt is afebrile, no CVA tenderness, abdominal tenderness, normotensive, and denies N/V. Pt to be dc home with antibiotics and instructions to follow up with PCP if symptoms persist. Pt with mild symptoms of allergies without cough, sore throat. Lungs CTAB. No stridor.  No oropharynx edema. I doubt pharyngitis or PNA. Pt to treat with sudafed.  Pt also with vaginal polyps on exam, with tenderness. Visualized polyps without signs of infection. [facility]   Discussed return precautions with patient. Discussed all results and patient verbalizes understanding and agrees with plan.  Case has been discussed with Dr. Mingo Amber who agrees with the above plan and to discharge.        Pura Spice, PA-C 04/27/14 1414  Evelina Bucy, MD 04/27/14 [phone]

## 2014-04-28 LAB — GC/CHLAMYDIA PROBE AMP
CT Probe RNA: NEGATIVE
GC PROBE AMP APTIMA: NEGATIVE

## 2014-06-07 ENCOUNTER — Telehealth: Payer: Self-pay | Admitting: *Deleted

## 2014-06-07 NOTE — Telephone Encounter (Signed)
Jane Hanson called and left a message that she has vaginal polyps causing severe pain in abdominal area.  [name] and per discussion with her and chart review was seen at [facility] in December 1 and told had vaginal polyps.  She states she is taking motrin 3 tabs every 4 to 6 hours without any relief. States she is out of tramadol given in October.  History of fibroids and endometriosis with hysterectomy in 2014. Instructed her not to take more than 3 tabs of motrin in 6 hours.Instructed to keep the appointment she has scheduled in February .  Instructed her to come to [facility] for evaluation as our doctors can not prescribe pain medicine without seeing her and evaluating her first.

## 2014-06-15 ENCOUNTER — Encounter (HOSPITAL_COMMUNITY): Payer: Self-pay | Admitting: *Deleted

## 2014-06-15 ENCOUNTER — Inpatient Hospital Stay (HOSPITAL_COMMUNITY)
Admission: AD | Admit: 2014-06-15 | Discharge: 2014-06-15 | Disposition: A | Discharge status: Home / Self Care | Payer: Medicaid Other | Source: Ambulatory Visit | Attending: Obstetrics and Gynecology | Admitting: Obstetrics and Gynecology

## 2014-06-15 DIAGNOSIS — N842 Polyp of vagina: Secondary | ICD-10-CM

## 2014-06-15 DIAGNOSIS — Z90710 Acquired absence of both cervix and uterus: Secondary | ICD-10-CM | POA: Diagnosis not present

## 2014-06-15 DIAGNOSIS — N6012 Diffuse cystic mastopathy of left breast: Secondary | ICD-10-CM | POA: Diagnosis not present

## 2014-06-15 DIAGNOSIS — N6011 Diffuse cystic mastopathy of right breast: Secondary | ICD-10-CM | POA: Diagnosis not present

## 2014-06-15 DIAGNOSIS — R102 Pelvic and perineal pain: Secondary | ICD-10-CM | POA: Diagnosis present

## 2014-06-15 DIAGNOSIS — R109 Unspecified abdominal pain: Secondary | ICD-10-CM | POA: Diagnosis present

## 2014-06-15 HISTORY — DX: Mental disorder, not otherwise specified: F99

## 2014-06-15 HISTORY — DX: Bipolar II disorder: F31.81

## 2014-06-15 HISTORY — DX: Schizophrenia, unspecified: F20.9

## 2014-06-15 HISTORY — DX: Solitary cyst of unspecified breast: N60.09

## 2014-06-15 HISTORY — DX: Post-traumatic stress disorder, unspecified: F43.10

## 2014-06-15 LAB — URINALYSIS, ROUTINE W REFLEX MICROSCOPIC
BILIRUBIN URINE: NEGATIVE
GLUCOSE, UA: NEGATIVE mg/dL
Hgb urine dipstick: NEGATIVE
KETONES UR: NEGATIVE mg/dL
Leukocytes, UA: NEGATIVE
Nitrite: NEGATIVE
PH: 7.5 (ref 5.0–8.0)
PROTEIN: NEGATIVE mg/dL
SPECIFIC GRAVITY, URINE: 1.02 (ref 1.005–1.030)
Urobilinogen, UA: 0.2 mg/dL (ref 0.0–1.0)

## 2014-06-15 MED ORDER — OXYCODONE-ACETAMINOPHEN 5-325 MG PO TABS
2.0000 | ORAL_TABLET | Freq: Once | ORAL | Status: AC
  Administered 2014-06-15: 2 via ORAL
  Filled 2014-06-15: qty 2

## 2014-06-15 MED ORDER — OXYCODONE-ACETAMINOPHEN 5-325 MG PO TABS: 1.0000 | ORAL_TABLET | Freq: Four times a day (QID) | ORAL | Status: DC | PRN

## 2014-06-15 NOTE — Discharge Instructions (Signed)
Fibrocystic Breast Changes Fibrocystic breast changes occur when breast ducts become blocked, causing painful, fluid-filled lumps (cysts) to form in the breast. This is a common condition that is noncancerous (benign). It occurs when women go through hormonal changes during their menstrual cycle. Fibrocystic breast changes can affect one or both breasts. CAUSES  The exact cause of fibrocystic breast changes is not known, but it may be related to the female hormones estrogen and progesterone. Family traits that get passed from parent to child (genetics) may also be a factor in some cases. SIGNS AND SYMPTOMS   Tenderness, mild discomfort, or pain.   Swelling.   Ropelike feeling when touching the breast.   Lumpy breast, one or both sides.   Changes in breast size, especially before (larger) and after (smaller) the menstrual period.   Green or dark brown nipple discharge (not blood).  Symptoms are usually worse before menstrual periods start and get better toward the end of the menstrual period.  DIAGNOSIS  To make a diagnosis, your health care provider will ask you questions and perform a physical exam of your breasts. The health care provider may recommend other tests that can examine inside your breasts, such as:  A breast X-ray (mammogram).   Ultrasonography.  An MRI.  If something more than fibrocystic breast changes is suspected, your health care provider may take a breast tissue sample (breast biopsy) to examine. TREATMENT  Often, treatment is not needed. Your health care provider may recommend over-the-counter pain relievers to help lessen pain or discomfort caused by the fibrocystic breast changes. You may also be asked to change your diet to limit or stop eating foods or drinking beverages that contain caffeine. Foods and beverages that contain caffeine include chocolate, soda, coffee, and tea. Reducing sugar and fat in your diet may also help. Your health care provider may  also recommend:  Fine needle aspiration to remove fluid from a cyst that is causing pain.   Surgery to remove a large, persistent, and tender cyst. HOME CARE INSTRUCTIONS   Examine your breasts after every menstrual period. If you do not have menstrual periods, check your breasts the first day of every month. Feel for changes, such as more tenderness, a new growth, a change in breast size, or a change in a lump that has always been there.   Only take over-the-counter or prescription medicine as directed by your health care provider.   Wear a well-fitted support or sports bra, especially when exercising.   Decrease or avoid caffeine, fat, and sugar in your diet as directed by your health care provider.  SEEK MEDICAL CARE IF:   You have fluid leaking (discharge) from your nipples, especially bloody discharge.   You have new lumps or bumps in the breast.   Your breast or breasts become enlarged, red, and painful.   You have areas of your breast that pucker in.   Your nipples appear flat or indented.  Document Released: 02/28/2006 Document Revised: 05/19/2013 Document Reviewed: 11/02/2012 [facility] Patient Information 2015 [place], [place]. This information is not intended to replace advice given to you by your health care provider. Make sure you discuss any questions you have with your health care provider.

## 2014-06-15 NOTE — [facility] Note (Signed)
Was dx with vag polyps.  Has appt at clinic on 02/07.  Can't deal with the pain anymore, so came in.

## 2014-06-15 NOTE — MAU Provider Note (Signed)
History     CSN: [id number]  Arrival date and time: 06/15/14 1042   First Provider Initiated Contact with Patient 06/15/14 1140      Chief Complaint  Patient presents with  . Vaginal Pain   The history is provided by the patient.   [name] is a 29 y.o. [phone] with a history for hysterectomy 2/2 endometriosis, vaginal polyps, and fibrocystic breast disease who presents to MAU complaining of severe vaginal and abdominal pain, as well as bilateral breast engorgement and tenderness.  States was diagnosed with vaginal polyps at ED on 04/27/2014 after presenting there with abdominal pain. Has an appointment scheduled at [facility] on 07/08/2014, was not able to schedule an earlier appointment. Pain today 9/10. Endorses scant clear vaginal D/C. Denies any vaginal bleeding, back pain, dysuria, urinary frequency or urgency, fever, chills or malaise. Treated pain at home with ibuprofen 400 mg Q4-6hrs, without relief.   Also reports bilateral breast "swelling" and tenderness x2d and asks if we can evaluate "her hormones". Denies any localized redness, warmth or nipple discharge. Reports pain is cyclical, associated with palpable lumps in the breasts, and corresponds with "menstruation" symptoms of cramping and bloating. Has been going on ever since hysterectomy in 05/2012.    Past Medical History  Diagnosis Date  . Asthma   . Kidney stone   . Fibroid   . Endometriosis   . Ovarian cyst   . Breast cyst   . Mental disorder   . Bipolar 2 disorder 03/2014  . Schizophrenia 03/2014  . PTSD (post-traumatic stress disorder) 03/2014    Past Surgical History  Procedure Laterality Date  . Cholecystectomy    . Appendectomy    . Abdominal hysterectomy  2014  . Appendectomy      Family History  Problem Relation Age of Onset  . Cancer Mother   . Asthma Father   . Hypertension Mother   . Hypertension Father   . Seizures Maternal Aunt   . Emphysema Father   . Lung disease Father      History  Substance Use Topics  . Smoking status: Never Smoker   . Smokeless tobacco: Never Used  . Alcohol Use: No    Allergies:  Allergies  Allergen Reactions  . Aspirin Rash  . Latex Rash  . Morphine And Related Rash    Facility-administered medications prior to admission  Medication Dose Route Frequency Provider Last Rate Last Dose  . medroxyPROGESTERone (DEPO-PROVERA) injection 150 mg  150 mg Intramuscular Q90 days Lavonia Drafts, MD   150 mg at 12/17/13 1700   Prescriptions prior to admission  Medication Sig Dispense Refill Last Dose  . albuterol (PROVENTIL HFA;VENTOLIN HFA) 108 (90 BASE) MCG/ACT inhaler Inhale 1-2 puffs into the lungs every 6 (six) hours as needed for wheezing.   Past Month at Unknown time  . albuterol (PROVENTIL) (2.5 MG/3ML) 0.083% nebulizer solution Take 2.5 mg by nebulization every 6 (six) hours as needed for wheezing.   Past Month at Unknown time  . ibuprofen (ADVIL,MOTRIN) 200 MG tablet Take 400 mg by mouth every 6 (six) hours as needed for headache or mild pain.   06/14/2014 at Unknown time  . mometasone-formoterol (DULERA) 100-5 MCG/ACT AERO Inhale 2 puffs into the lungs daily.    06/15/2014 at Unknown time  . cephALEXin (KEFLEX) 500 MG capsule Take 1 capsule (500 mg total) by mouth 2 (two) times daily. 20 capsule 0   . metroNIDAZOLE (FLAGYL) 500 MG tablet Take 1 tablet (500 mg total)  by mouth 2 (two) times daily. Do not drink alcohol with this medication. (Patient not taking: Reported on 04/27/2014) 14 tablet 0 Not Taking at Unknown time  . ondansetron (ZOFRAN) 4 MG tablet Take 1 tablet (4 mg total) by mouth every 6 (six) hours. (Patient not taking: Reported on 04/27/2014) 12 tablet 0 Not Taking at Unknown time  . traMADol (ULTRAM) 50 MG tablet Take 1 tablet (50 mg total) by mouth every 6 (six) hours as needed. (Patient not taking: Reported on 06/15/2014) 15 tablet 0 04/26/2014 at Unknown time    Review of Systems  Constitutional: Negative for  fever, chills and malaise/fatigue.  Gastrointestinal: Positive for abdominal pain (low abdominal). Negative for nausea, vomiting, diarrhea and constipation.  Genitourinary: Negative for dysuria, urgency, frequency, hematuria and flank pain.   Physical Exam   Blood pressure 115/71, pulse 61, temperature 98.1 F (36.7 C), temperature source Oral, resp. rate 17, height 5' 8.9" (1.75 m), weight 54.069 kg (119 lb 3.2 oz), SpO2 100 %.  Physical Exam  Vitals reviewed. Constitutional: She is oriented to person, place, and time. She appears well-developed and well-nourished. No distress.  HENT:  Head: Normocephalic.  Cardiovascular: Normal rate and intact distal pulses.   Respiratory: Effort normal. No respiratory distress.  Genitourinary: There is breast tenderness (bilateral breasts, surrounding the areola, L>R; palpable mobile small <5 mm masses surrounding the areola on the left). No breast swelling, discharge or bleeding. Pelvic exam was performed with patient supine. There is no rash, tenderness, lesion or injury on the right labia. There is no rash, tenderness, lesion or injury on the left labia. There is tenderness (significant pain during speculum examination) in the vagina. No erythema or bleeding in the vagina. No foreign body around the vagina. No signs of injury around the vagina. Vaginal discharge (minimal amount of thic clear to white) found.  3 flesh colored vaginal polyps visualized:  -at 9 o'clock near the introitus, 1x0.5 cm  -superior vaginal well, 1x1 cm -superior lateral right vaginal wall, 1x0.5 cm  No evidence of erythema, edema, bleeding or ulceration.   Deferred bimanual exam secondary to significant discomfort despite pain medication.   Musculoskeletal: She exhibits no edema.  Neurological: She is alert and oriented to person, place, and time.  Skin: Skin is warm and dry.    MAU Course  Procedures  MDM No evidence of emergent problems on physical exam.    Assessment and Plan   1. Vaginal polyps/lesions  2. Fibrocystic breast disease.   Plan:  Pain management until patient can be seen in clinic on 2/11.  Given rx for Percocet.  Bilateral breast U/S at [place] F/U at [facility] on 07/08/2014 as scheduled.  Return to MAU if needed.    Misior, Nani Skillern  06/15/2014 12:25 PM  I have seen this patient and agree with the above student's note.  LEFTWICH-KIRBY, LISA 06/15/2014, 11:43 AM

## 2014-06-15 NOTE — Progress Notes (Signed)
Written and verbal d/c instructions given and understanding voiced. Discussed with pt earlier by Fatima Blank CNM.

## 2014-06-16 ENCOUNTER — Other Ambulatory Visit (HOSPITAL_COMMUNITY): Payer: Self-pay | Admitting: Advanced Practice Midwife

## 2014-06-22 ENCOUNTER — Other Ambulatory Visit: Payer: Medicaid Other

## 2014-06-23 ENCOUNTER — Ambulatory Visit
Admission: RE | Admit: 2014-06-23 | Discharge: 2014-06-23 | Disposition: A | Discharge status: Home / Self Care | Payer: Medicaid Other | Source: Ambulatory Visit | Attending: Advanced Practice Midwife | Admitting: Advanced Practice Midwife

## 2014-06-23 DIAGNOSIS — N6012 Diffuse cystic mastopathy of left breast: Principal | ICD-10-CM

## 2014-06-23 DIAGNOSIS — N6011 Diffuse cystic mastopathy of right breast: Secondary | ICD-10-CM

## 2014-07-08 ENCOUNTER — Encounter: Payer: Self-pay | Admitting: Obstetrics and Gynecology

## 2014-07-08 ENCOUNTER — Ambulatory Visit (INDEPENDENT_AMBULATORY_CARE_PROVIDER_SITE_OTHER): Payer: Medicaid Other | Admitting: Obstetrics and Gynecology

## 2014-07-08 VITALS — BP 116/71 | HR 58 | Temp 97.2°F | Wt 114.9 lb

## 2014-07-08 DIAGNOSIS — N809 Endometriosis, unspecified: Secondary | ICD-10-CM

## 2014-07-08 DIAGNOSIS — N760 Acute vaginitis: Secondary | ICD-10-CM

## 2014-07-08 MED ORDER — OXYCODONE-ACETAMINOPHEN 5-325 MG PO TABS: 1.0000 | ORAL_TABLET | Freq: Four times a day (QID) | ORAL | Status: DC | PRN

## 2014-07-08 MED ORDER — MEDROXYPROGESTERONE ACETATE 150 MG/ML IM SUSP
150.0000 mg | INTRAMUSCULAR | Status: DC
  Administered 2014-07-08: 150 mg via INTRAMUSCULAR

## 2014-07-08 NOTE — Progress Notes (Signed)
Patient ID: Jane Hanson, female   DOB: 05-02-1986, 29 y.o.   MRN: [id number] 29 yo G3P2103 s/p hysterectomy in 2014 with diagnosis of endometriosis presenting today for the evaluation of lower abdominal pain and vaginal polyp. Patient states her pain is Jane Hanson and rates it a 8/10. She experiences dyspareunia particularly with penetration. She states her pain was better controlled the first 3 months after receiving depo-provera. She did not return for her second dose which was due in October.  Past Medical History  Diagnosis Date  . Asthma   . Kidney stone   . Fibroid   . Endometriosis   . Ovarian cyst   . Breast cyst   . Mental disorder   . Bipolar 2 disorder 03/2014  . Schizophrenia 03/2014  . PTSD (post-traumatic stress disorder) 03/2014   Past Surgical History  Procedure Laterality Date  . Cholecystectomy    . Appendectomy    . Abdominal hysterectomy  2014  . Appendectomy     Family History  Problem Relation Age of Onset  . Cancer Mother   . Asthma Father   . Hypertension Mother   . Hypertension Father   . Seizures Maternal Aunt   . Emphysema Father   . Lung disease Father    History  Substance Use Topics  . Smoking status: Never Smoker   . Smokeless tobacco: Never Used  . Alcohol Use: No   GENERAL: Well-developed, well-nourished female in no acute distress.  ABDOMEN: Soft, nontender, nondistended.  PELVIC: Normal external female genitalia. Vagina is pink and rugated.  No evidence of vaginal polyps. Vaginal vault is intact. Normal discharge. No adnexal mass or tenderness. Tenderness upon insertion of speculum EXTREMITIES: No cyanosis, clubbing, or edema, 2+ distal pulses.  A/P 29 yo with lower pelvic pain likely related to endometriosis - Advised to restart depo-provera - Refill on percocet provided - wet prep also collected - Patient will be contacted with any abnormal resutls - rtc prn

## 2014-07-09 LAB — WET PREP, GENITAL
Clue Cells Wet Prep HPF POC: NONE SEEN
TRICH WET PREP: NONE SEEN
WBC, Wet Prep HPF POC: NONE SEEN
YEAST WET PREP: NONE SEEN

## 2014-08-24 ENCOUNTER — Encounter (HOSPITAL_COMMUNITY): Payer: Self-pay | Admitting: *Deleted

## 2014-08-24 ENCOUNTER — Inpatient Hospital Stay (HOSPITAL_COMMUNITY)
Admission: AD | Admit: 2014-08-24 | Discharge: 2014-08-24 | Disposition: A | Discharge status: Home / Self Care | Payer: Medicaid Other | Source: Ambulatory Visit | Attending: Family Medicine | Admitting: Family Medicine

## 2014-08-24 ENCOUNTER — Inpatient Hospital Stay (HOSPITAL_COMMUNITY): Payer: Medicaid Other

## 2014-08-24 DIAGNOSIS — Z90710 Acquired absence of both cervix and uterus: Secondary | ICD-10-CM | POA: Insufficient documentation

## 2014-08-24 DIAGNOSIS — Z8742 Personal history of other diseases of the female genital tract: Secondary | ICD-10-CM | POA: Diagnosis not present

## 2014-08-24 DIAGNOSIS — N941 Dyspareunia: Secondary | ICD-10-CM | POA: Diagnosis not present

## 2014-08-24 DIAGNOSIS — J45909 Unspecified asthma, uncomplicated: Secondary | ICD-10-CM | POA: Diagnosis not present

## 2014-08-24 DIAGNOSIS — R102 Pelvic and perineal pain: Secondary | ICD-10-CM | POA: Diagnosis not present

## 2014-08-24 DIAGNOSIS — Z7951 Long term (current) use of inhaled steroids: Secondary | ICD-10-CM | POA: Diagnosis not present

## 2014-08-24 LAB — URINALYSIS, ROUTINE W REFLEX MICROSCOPIC
Glucose, UA: NEGATIVE mg/dL
Hgb urine dipstick: NEGATIVE
Ketones, ur: 15 mg/dL — AB
LEUKOCYTES UA: NEGATIVE
Nitrite: NEGATIVE
PH: 5.5 (ref 5.0–8.0)
PROTEIN: NEGATIVE mg/dL
Specific Gravity, Urine: >1.03 — ABNORMAL HIGH (ref 1.005–1.030)
UROBILINOGEN UA: 0.2 mg/dL (ref 0.0–1.0)

## 2014-08-24 LAB — WET PREP, GENITAL
Clue Cells Wet Prep HPF POC: NONE SEEN
Trich, Wet Prep: NONE SEEN
WBC WET PREP: NONE SEEN
Yeast Wet Prep HPF POC: NONE SEEN

## 2014-08-24 MED ORDER — NAPROXEN SODIUM 550 MG PO TABS: 550.0000 mg | ORAL_TABLET | Freq: Two times a day (BID) | ORAL | Status: DC

## 2014-08-24 MED ORDER — TRAMADOL HCL 50 MG PO TABS: 50.0000 mg | ORAL_TABLET | Freq: Four times a day (QID) | ORAL | Status: DC | PRN

## 2014-08-24 MED ORDER — KETOROLAC TROMETHAMINE 60 MG/2ML IM SOLN
60.0000 mg | Freq: Once | INTRAMUSCULAR | Status: AC
  Administered 2014-08-24: 60 mg via INTRAMUSCULAR
  Filled 2014-08-24: qty 2

## 2014-08-24 MED ORDER — PROMETHAZINE HCL 25 MG PO TABS: 25.0000 mg | ORAL_TABLET | Freq: Four times a day (QID) | ORAL | Status: DC | PRN

## 2014-08-24 NOTE — MAU Note (Signed)
C/o fever and nausea for past 3 days; c/o diarrhea; c/o abdominal pain for past week;

## 2014-08-24 NOTE — MAU Note (Signed)
Seen here in the clinic; had hysterectomy due to fibroids and endometriosis; given Percocet after surgery and has ran out of her rx;c/o increased abdominal pain;c/o fever but temp was 98.3;

## 2014-08-24 NOTE — MAU Provider Note (Addendum)
History     CSN: [id number]  Arrival date and time: 08/24/14 1421   None     Chief Complaint  Patient presents with  . Nausea  . Abdominal Pain   HPI pt is not pregnant and presents with abdominal pain Pt has  hx of endometriosis with hysterectomy elsewhere.  Pt has been seen in the clinic and started on Depo Provera with repeat dose in 1 month.  Pt states her pain has been constant but worse over the past 2 days. Pt states she has taken percocet, which did not help the pain Pt has dyspareunia and does not have sex with husband of 2 years due to pain Pt denies constipation or UTI sx or    RN note:  Registered Nurse Signed  MAU Note 08/24/2014 2:59 PM    Expand All Collapse All   Seen here in the clinic; had hysterectomy due to fibroids and endometriosis; given Percocet after surgery and has ran out of her rx;c/o increased abdominal pain;c/o fever but temp was 98.3;         Past Medical History  Diagnosis Date  . Asthma   . Kidney stone   . Fibroid   . Endometriosis   . Ovarian cyst   . Breast cyst   . Mental disorder   . Bipolar 2 disorder 03/2014  . Schizophrenia 03/2014  . PTSD (post-traumatic stress disorder) 03/2014    Past Surgical History  Procedure Laterality Date  . Cholecystectomy    . Appendectomy    . Abdominal hysterectomy  2014  . Appendectomy      Family History  Problem Relation Age of Onset  . Cancer Mother   . Asthma Father   . Hypertension Mother   . Hypertension Father   . Seizures Maternal Aunt   . Emphysema Father   . Lung disease Father     History  Substance Use Topics  . Smoking status: Never Smoker   . Smokeless tobacco: Never Used  . Alcohol Use: No    Allergies:  Allergies  Allergen Reactions  . Aspirin Rash  . Latex Rash    Facility-administered medications prior to admission  Medication Dose Route Frequency Provider Last Rate Last Dose  . medroxyPROGESTERone (DEPO-PROVERA) injection 150 mg  150 mg  Intramuscular Q90 days Mora Bellman, MD   150 mg at 07/08/14 1501   Prescriptions prior to admission  Medication Sig Dispense Refill Last Dose  . albuterol (PROVENTIL HFA;VENTOLIN HFA) 108 (90 BASE) MCG/ACT inhaler Inhale 1-2 puffs into the lungs every 6 (six) hours as needed for wheezing.   Taking  . ibuprofen (ADVIL,MOTRIN) 200 MG tablet Take 400 mg by mouth every 6 (six) hours as needed for headache or mild pain.   Taking  . mometasone-formoterol (DULERA) 100-5 MCG/ACT AERO Inhale 2 puffs into the lungs daily.    Taking  . oxyCODONE-acetaminophen (PERCOCET/ROXICET) 5-325 MG per tablet Take 1-2 tablets by mouth every 6 (six) hours as needed. 30 tablet 0     Review of Systems  Constitutional: Negative for fever and chills.  Gastrointestinal: Positive for abdominal pain. Negative for diarrhea and constipation.  Genitourinary: Negative for dysuria.  Musculoskeletal: Positive for back pain.   Physical Exam   Blood pressure 109/78, pulse 67, temperature 98.2 F (36.8 C), temperature source Oral, resp. rate 18, height 5' 4.5" (1.638 m), weight 112 lb (50.803 kg).  Physical Exam  Nursing note and vitals reviewed. Constitutional: She is oriented to person, place, and time. She  appears well-developed and well-nourished. No distress.  HENT:  Head: Normocephalic.  Eyes: Pupils are equal, round, and reactive to light.  Neck: Normal range of motion. Neck supple.  Cardiovascular: Normal rate.   Respiratory: Effort normal.  GI: Soft. There is tenderness. There is no rebound and no guarding.  Genitourinary:  Vagina clean, moist, nodularity at cuff; bimanual diffusely tender- no rebound  Musculoskeletal: Normal range of motion.  Neurological: She is alert and oriented to person, place, and time.  Skin: Skin is warm and dry.  Psychiatric: She has a normal mood and affect.    MAU Course  Procedures US Ob Transvaginal  08/24/2014   CLINICAL DATA:  Lower abdominal pain.  EXAM: TRANSABDOMINAL  AND TRANSVAGINAL ULTRASOUND OF PELVIS  TECHNIQUE: Both transabdominal and transvaginal ultrasound examinations of the pelvis were performed. Transabdominal technique was performed for global imaging of the pelvis including uterus, ovaries, adnexal regions, and pelvic cul-de-sac. It was necessary to proceed with endovaginal exam following the transabdominal exam to visualize the ovaries.  COMPARISON:  09/02/2013  FINDINGS: Uterus  Removed.  Right ovary  Measurements: 3.2 x 2.4 x 2.0 cm. Normal appearance/no adnexal mass.  Left ovary  Measurements: 2.7 x 1.8 x 2.2 cm. Normal appearance/no adnexal mass.  Other findings  No free fluid.  IMPRESSION: Normal appearing ovaries. No mass lesions or free fluid. Active peristalsis in multiple small bowel loops in the pelvis.   Electronically Signed   By: Lorriane Shire M.D.   On: 08/24/2014 17:56   US Pelvis Complete  08/24/2014   CLINICAL DATA:  Lower abdominal pain.  EXAM: TRANSABDOMINAL AND TRANSVAGINAL ULTRASOUND OF PELVIS  TECHNIQUE: Both transabdominal and transvaginal ultrasound examinations of the pelvis were performed. Transabdominal technique was performed for global imaging of the pelvis including uterus, ovaries, adnexal regions, and pelvic cul-de-sac. It was necessary to proceed with endovaginal exam following the transabdominal exam to visualize the ovaries.  COMPARISON:  09/02/2013  FINDINGS: Uterus  Removed.  Right ovary  Measurements: 3.2 x 2.4 x 2.0 cm. Normal appearance/no adnexal mass.  Left ovary  Measurements: 2.7 x 1.8 x 2.2 cm. Normal appearance/no adnexal mass.  Other findings  No free fluid.  IMPRESSION: Normal appearing ovaries. No mass lesions or free fluid. Active peristalsis in multiple small bowel loops in the pelvis.   Electronically Signed   By: Lorriane Shire M.D.   On: 08/24/2014 17:56   Results for orders placed or performed during the hospital encounter of 08/24/14 (from the past 24 hour(s))  Urinalysis, Routine w reflex microscopic      Status: Abnormal   Collection Time: 08/24/14  2:44 PM  Result Value Ref Range   Color, Urine YELLOW YELLOW   APPearance CLEAR CLEAR   Specific Gravity, Urine >1.030 (H) 1.005 - 1.030   pH 5.5 5.0 - 8.0   Glucose, UA NEGATIVE NEGATIVE mg/dL   Hgb urine dipstick NEGATIVE NEGATIVE   Bilirubin Urine SMALL (A) NEGATIVE   Ketones, ur 15 (A) NEGATIVE mg/dL   Protein, ur NEGATIVE NEGATIVE mg/dL   Urobilinogen, UA 0.2 0.0 - 1.0 mg/dL   Nitrite NEGATIVE NEGATIVE   Leukocytes, UA NEGATIVE NEGATIVE  Wet prep, genital     Status: None   Collection Time: 08/24/14  3:29 PM  Result Value Ref Range   Yeast Wet Prep HPF POC NONE SEEN NONE SEEN   Trich, Wet Prep NONE SEEN NONE SEEN   Clue Cells Wet Prep HPF POC NONE SEEN NONE SEEN   WBC, Wet Prep  HPF POC NONE SEEN NONE SEEN  Toradol 60mg  IM given for pain  Assessment and Plan  Pelvic pain Hx endometriosis Rx Tramadol, phenergan and anaprox DS or Motrin F/u with clinic for 2nd dose Depo- sooner if increase in pain  LINEBERRY,SUSAN 08/24/2014, 3:04 PM    Attestation of Attending Supervision of Advanced Practitioner (PA/CNM/NP): Evaluation and management procedures were performed by the Advanced Practitioner under my supervision and collaboration.  I have reviewed the Advanced Practitioner's note and chart, and I agree with the management and plan.  Loma Boston, DO Attending Physician Faculty [facility], [place]

## 2014-08-25 LAB — GC/CHLAMYDIA PROBE AMP (~~LOC~~) NOT AT ARMC
Chlamydia: NEGATIVE
NEISSERIA GONORRHEA: NEGATIVE

## 2014-09-27 ENCOUNTER — Ambulatory Visit: Payer: Medicaid Other

## 2014-10-01 ENCOUNTER — Encounter (HOSPITAL_COMMUNITY): Payer: Self-pay | Admitting: *Deleted

## 2014-10-01 ENCOUNTER — Emergency Department (INDEPENDENT_AMBULATORY_CARE_PROVIDER_SITE_OTHER)
Admission: EM | Admit: 2014-10-01 | Discharge: 2014-10-01 | Disposition: A | Discharge status: Home / Self Care | Payer: Medicaid Other | Source: Home / Self Care | Attending: Family Medicine | Admitting: Family Medicine

## 2014-10-01 DIAGNOSIS — N760 Acute vaginitis: Secondary | ICD-10-CM

## 2014-10-01 DIAGNOSIS — J0101 Acute recurrent maxillary sinusitis: Secondary | ICD-10-CM

## 2014-10-01 DIAGNOSIS — A499 Bacterial infection, unspecified: Secondary | ICD-10-CM

## 2014-10-01 DIAGNOSIS — B9689 Other specified bacterial agents as the cause of diseases classified elsewhere: Secondary | ICD-10-CM

## 2014-10-01 LAB — POCT URINALYSIS DIP (DEVICE)
GLUCOSE, UA: NEGATIVE mg/dL
Hgb urine dipstick: NEGATIVE
Ketones, ur: 15 mg/dL — AB
LEUKOCYTES UA: NEGATIVE
NITRITE: NEGATIVE
PROTEIN: 30 mg/dL — AB
Specific Gravity, Urine: >=1.03 (ref 1.005–1.030)
Urobilinogen, UA: 0.2 mg/dL (ref 0.0–1.0)
pH: 5.5 (ref 5.0–8.0)

## 2014-10-01 LAB — POCT PREGNANCY, URINE: Preg Test, Ur: NEGATIVE

## 2014-10-01 MED ORDER — METRONIDAZOLE 500 MG PO TABS: 500.0000 mg | ORAL_TABLET | Freq: Two times a day (BID) | ORAL | Status: DC

## 2014-10-01 MED ORDER — MINOCYCLINE HCL 100 MG PO CAPS: 100.0000 mg | ORAL_CAPSULE | Freq: Two times a day (BID) | ORAL | Status: DC

## 2014-10-01 MED ORDER — IPRATROPIUM BROMIDE 0.06 % NA SOLN: 2.0000 | Freq: Four times a day (QID) | NASAL | Status: DC

## 2014-10-01 MED ORDER — METRONIDAZOLE 0.75 % VA GEL: 1.0000 | Freq: Every day | VAGINAL | Status: DC

## 2014-10-01 NOTE — ED Provider Notes (Signed)
CSN: [id number]     Arrival date & time 10/01/14  1019 History   None    Chief Complaint  Patient presents with  . Vaginitis   (Consider location/radiation/quality/duration/timing/severity/associated sxs/prior Treatment) Patient is a 29 y.o. female presenting with vaginal discharge. The history is provided by the patient.  Vaginal Discharge Quality:  Malodorous Severity:  Mild Onset quality:  Gradual Duration:  3 days Chronicity:  Recurrent Context: not during intercourse, not during pregnancy and not recent antibiotic use   Associated symptoms: no abdominal pain, no dyspareunia, no fever, no genital lesions and no nausea   Risk factors: gynecological surgery   Risk factors comment:  S/p hyster   Past Medical History  Diagnosis Date  . Asthma   . Kidney stone   . Fibroid   . Endometriosis   . Ovarian cyst   . Breast cyst   . Mental disorder   . Bipolar 2 disorder 03/2014  . Schizophrenia 03/2014  . PTSD (post-traumatic stress disorder) 03/2014   Past Surgical History  Procedure Laterality Date  . Cholecystectomy    . Appendectomy    . Abdominal hysterectomy  2014  . Appendectomy     Family History  Problem Relation Age of Onset  . Cancer Mother   . Asthma Father   . Hypertension Mother   . Hypertension Father   . Seizures Maternal Aunt   . Emphysema Father   . Lung disease Father    History  Substance Use Topics  . Smoking status: Never Smoker   . Smokeless tobacco: Never Used  . Alcohol Use: No   OB History    Gravida Para Term Preterm AB TAB SAB Ectopic Multiple Living   3 3 2 1      3      Review of Systems  Constitutional: Negative.  Negative for fever.  HENT: Positive for congestion, postnasal drip and rhinorrhea.   Gastrointestinal: Negative.  Negative for nausea and abdominal pain.  Genitourinary: Positive for vaginal discharge. Negative for dyspareunia.  Neurological: Positive for headaches.    Allergies  Aspirin and Latex  Home  Medications   Prior to Admission medications   Medication Sig Start Date End Date Taking? Authorizing Provider  albuterol (PROVENTIL HFA;VENTOLIN HFA) 108 (90 BASE) MCG/ACT inhaler Inhale 1-2 puffs into the lungs every 6 (six) hours as needed for wheezing.    Historical Provider, MD  ibuprofen (ADVIL,MOTRIN) 200 MG tablet Take 400 mg by mouth every 6 (six) hours as needed for headache or mild pain.    Historical Provider, MD  ipratropium (ATROVENT) 0.06 % nasal spray Place 2 sprays into both nostrils 4 (four) times daily. 10/01/14   Billy Fischer, MD  metroNIDAZOLE (FLAGYL) 500 MG tablet Take 1 tablet (500 mg total) by mouth 2 (two) times daily. 10/01/14   Billy Fischer, MD  metroNIDAZOLE (METROGEL VAGINAL) 0.75 % vaginal gel Place 1 Applicatorful vaginally at bedtime. For 5 nights 10/01/14   Billy Fischer, MD  minocycline (MINOCIN,DYNACIN) 100 MG capsule Take 1 capsule (100 mg total) by mouth 2 (two) times daily. 10/01/14   Billy Fischer, MD  mometasone-formoterol (DULERA) 100-5 MCG/ACT AERO Inhale 2 puffs into the lungs daily.     Historical Provider, MD  Multiple Vitamin (MULTIVITAMIN WITH MINERALS) TABS tablet Take 1 tablet by mouth daily.    Historical Provider, MD  naproxen sodium (ANAPROX DS) 550 MG tablet Take 1 tablet (550 mg total) by mouth 2 (two) times daily with a meal. 08/24/14  West Pugh, NP  promethazine (PHENERGAN) 25 MG tablet Take 1 tablet (25 mg total) by mouth every 6 (six) hours as needed for nausea or vomiting. 08/24/14   West Pugh, NP  traMADol (ULTRAM) 50 MG tablet Take 1 tablet (50 mg total) by mouth every 6 (six) hours as needed. 08/24/14   West Pugh, NP   BP 117/84 mmHg  Pulse 64  Temp(Src) 97.6 F (36.4 C) (Oral)  Resp 14  SpO2 99% Physical Exam  Constitutional: She is oriented to person, place, and time. She appears well-developed and well-nourished.  Neck: Normal range of motion. Neck supple.  Cardiovascular: Normal heart sounds and intact distal  pulses.   Pulmonary/Chest: Effort normal and breath sounds normal.  Abdominal: Soft. Bowel sounds are normal. She exhibits no distension and no mass. There is no tenderness. There is no rebound and no guarding.  Lymphadenopathy:    She has no cervical adenopathy.  Neurological: She is alert and oriented to person, place, and time.  Skin: Skin is warm and dry.  Nursing note and vitals reviewed.   ED Course  Procedures (including critical care time) Labs Review Labs Reviewed  POCT URINALYSIS DIP (DEVICE) - Abnormal; Notable for the following:    Bilirubin Urine SMALL (*)    Ketones, ur 15 (*)    Protein, ur 30 (*)    All other components within normal limits  POCT PREGNANCY, URINE    Imaging Review No results found.   MDM   1. BV (bacterial vaginosis)   2. Acute recurrent maxillary sinusitis        Billy Fischer, MD 10/01/14 1228

## 2014-10-01 NOTE — ED Notes (Signed)
Pt  Reports     Multiple     Complaints         Vaginal  Odor   And      Fever         Headache  And  Hoarseness          With  Symptoms               For  Several  Weeks

## 2014-10-29 ENCOUNTER — Inpatient Hospital Stay (HOSPITAL_COMMUNITY)
Admission: AD | Admit: 2014-10-29 | Discharge: 2014-10-29 | Disposition: A | Discharge status: Home / Self Care | Payer: Medicaid Other | Source: Ambulatory Visit | Attending: Family Medicine | Admitting: Family Medicine

## 2014-10-29 ENCOUNTER — Encounter (HOSPITAL_COMMUNITY): Payer: Self-pay | Admitting: *Deleted

## 2014-10-29 DIAGNOSIS — Z90710 Acquired absence of both cervix and uterus: Secondary | ICD-10-CM | POA: Diagnosis not present

## 2014-10-29 DIAGNOSIS — A499 Bacterial infection, unspecified: Secondary | ICD-10-CM | POA: Diagnosis not present

## 2014-10-29 DIAGNOSIS — Z87442 Personal history of urinary calculi: Secondary | ICD-10-CM | POA: Insufficient documentation

## 2014-10-29 DIAGNOSIS — N898 Other specified noninflammatory disorders of vagina: Secondary | ICD-10-CM | POA: Diagnosis present

## 2014-10-29 DIAGNOSIS — R102 Pelvic and perineal pain: Secondary | ICD-10-CM | POA: Insufficient documentation

## 2014-10-29 DIAGNOSIS — G8929 Other chronic pain: Secondary | ICD-10-CM

## 2014-10-29 DIAGNOSIS — N760 Acute vaginitis: Secondary | ICD-10-CM | POA: Insufficient documentation

## 2014-10-29 DIAGNOSIS — B9689 Other specified bacterial agents as the cause of diseases classified elsewhere: Secondary | ICD-10-CM

## 2014-10-29 LAB — WET PREP, GENITAL
Trich, Wet Prep: NONE SEEN
Yeast Wet Prep HPF POC: NONE SEEN

## 2014-10-29 LAB — HCG, QUANTITATIVE, PREGNANCY: hCG, Beta Chain, Quant, S: <1 m[IU]/mL (ref <5)

## 2014-10-29 MED ORDER — METRONIDAZOLE 500 MG PO TABS: 500.0000 mg | ORAL_TABLET | Freq: Two times a day (BID) | ORAL | Status: DC

## 2014-10-29 NOTE — Discharge Instructions (Signed)
Bacterial Vaginosis Bacterial vaginosis is a vaginal infection that occurs when the normal balance of bacteria in the vagina is disrupted. It results from an overgrowth of certain bacteria. This is the most common vaginal infection in women of childbearing age. Treatment is important to prevent complications, especially in pregnant women, as it can cause a premature delivery. CAUSES  Bacterial vaginosis is caused by an increase in harmful bacteria that are normally present in smaller amounts in the vagina. Several different kinds of bacteria can cause bacterial vaginosis. However, the reason that the condition develops is not fully understood. RISK FACTORS Certain activities or behaviors can put you at an increased risk of developing bacterial vaginosis, including:  Having a new sex partner or multiple sex partners.  Douching.  Using an intrauterine device (IUD) for contraception. Women do not get bacterial vaginosis from toilet seats, bedding, swimming pools, or contact with objects around them. SIGNS AND SYMPTOMS  Some women with bacterial vaginosis have no signs or symptoms. Common symptoms include:  Grey vaginal discharge.  A fishlike odor with discharge, especially after sexual intercourse.  Itching or burning of the vagina and vulva.  Burning or pain with urination. DIAGNOSIS  Your health care provider will take a medical history and examine the vagina for signs of bacterial vaginosis. A sample of vaginal fluid may be taken. Your health care provider will look at this sample under a microscope to check for bacteria and abnormal cells. A vaginal pH test may also be done.  TREATMENT  Bacterial vaginosis may be treated with antibiotic medicines. These may be given in the form of a pill or a vaginal cream. A second round of antibiotics may be prescribed if the condition comes back after treatment.  HOME CARE INSTRUCTIONS   Only take over-the-counter or prescription medicines as  directed by your health care provider.  If antibiotic medicine was prescribed, take it as directed. Make sure you finish it even if you start to feel better.  Do not have sex until treatment is completed.  Tell all sexual partners that you have a vaginal infection. They should see their health care provider and be treated if they have problems, such as a mild rash or itching.  Practice safe sex by using condoms and only having one sex partner. SEEK MEDICAL CARE IF:   Your symptoms are not improving after 3 days of treatment.  You have increased discharge or pain.  You have a fever. MAKE SURE YOU:   Understand these instructions.  Will watch your condition.  Will get help right away if you are not doing well or get worse. FOR MORE INFORMATION  Centers for Disease Control and Prevention, Division of STD Prevention: www.cdc.gov/std American Sexual Health Association (ASHA): www.ashastd.org  Document Released: 05/14/2005 Document Revised: 03/04/2013 Document Reviewed: 12/24/2012 Exit[employer] Patient Information 2015 [employer]. This information is not intended to replace advice given to you by your health care provider. Make sure you discuss any questions you have with your health care provider.  

## 2014-10-29 NOTE — MAU Note (Signed)
Urine in lab 

## 2014-10-29 NOTE — MAU Provider Note (Signed)
History     CSN: [id number]  Arrival date and time: 10/29/14 1053   First Provider Initiated Contact with Patient 10/29/14 1416      Chief Complaint  Patient presents with  . Vaginitis  . Positive upt    HPI Comments: Jane Hanson is a 29 y.o. [phone] has had hysterectomy but thinks she may be pregnant due to +HPT, not feeling well for 2 wks and having nipple discharge. Has a pruritic white vaginal discharge since finishing treatment for (presumptive) BV 3-4 wks ago, similar to yeast infection she's had in the past.has appointment in 6 days at [facility] to follow up her pelvic pain. Elects to get full STI testing at that visit. GC and chlamydia were negative one month ago and she states she is in a  monogamous relationship with husband.  Vaginal Discharge The patient's primary symptoms include genital itching and vaginal discharge. The patient's pertinent negatives include no genital lesions, genital odor, genital rash or vaginal bleeding. This is a new problem. The current episode started in the past 7 days. The problem occurs intermittently. The problem has been unchanged. The pain is moderate (chronic pelvic pain since hypsterectomy for endometriosis and fibroids). The problem affects both sides. She is not pregnant. Associated symptoms include painful intercourse. Pertinent negatives include no abdominal pain, back pain, constipation, diarrhea, dysuria, fever, flank pain, frequency, headaches, hematuria, urgency or vomiting. The vaginal discharge was milky and white. There has been no bleeding.    OB History    Gravida Para Term Preterm AB TAB SAB Ectopic Multiple Living   3 3 2 1      3        Past Medical History  Diagnosis Date  . Asthma   . Kidney stone   . Fibroid   . Endometriosis   . Ovarian cyst   . Breast cyst   . Mental disorder   . Bipolar 2 disorder 03/2014  . Schizophrenia 03/2014  . PTSD (post-traumatic stress disorder) 03/2014    Past Surgical  History  Procedure Laterality Date  . Cholecystectomy    . Appendectomy    . Abdominal hysterectomy  2014  . Appendectomy      Family History  Problem Relation Age of Onset  . Cancer Mother   . Asthma Father   . Hypertension Mother   . Hypertension Father   . Seizures Maternal Aunt   . Emphysema Father   . Lung disease Father     History  Substance Use Topics  . Smoking status: Never Smoker   . Smokeless tobacco: Never Used  . Alcohol Use: No    Allergies:  Allergies  Allergen Reactions  . Aspirin Rash  . Latex Rash    Facility-administered medications prior to admission  Medication Dose Route Frequency Provider Last Rate Last Dose  . medroxyPROGESTERone (DEPO-PROVERA) injection 150 mg  150 mg Intramuscular Q90 days Mora Bellman, MD   150 mg at 07/08/14 1501   Prescriptions prior to admission  Medication Sig Dispense Refill Last Dose  . albuterol (PROVENTIL HFA;VENTOLIN HFA) 108 (90 BASE) MCG/ACT inhaler Inhale 1-2 puffs into the lungs every 6 (six) hours as needed for wheezing.   prn  . albuterol (PROVENTIL) (2.5 MG/3ML) 0.083% nebulizer solution Take 2.5 mg by nebulization every 6 (six) hours as needed for wheezing or shortness of breath.   rescue  . ibuprofen (ADVIL,MOTRIN) 200 MG tablet Take 400 mg by mouth every 6 (six) hours as needed for headache or mild  pain.   10/29/2014 at Unknown time  . mometasone-formoterol (DULERA) 100-5 MCG/ACT AERO Inhale 2 puffs into the lungs daily.    10/29/2014 at Unknown time  . Multiple Vitamin (MULTIVITAMIN WITH MINERALS) TABS tablet Take 1 tablet by mouth daily.   10/29/2014 at Unknown time  . ipratropium (ATROVENT) 0.06 % nasal spray Place 2 sprays into both nostrils 4 (four) times daily. (Patient not taking: Reported on 10/29/2014) 15 mL 1 Not Taking at Unknown time  . metroNIDAZOLE (FLAGYL) 500 MG tablet Take 1 tablet (500 mg total) by mouth 2 (two) times daily. (Patient not taking: Reported on 10/29/2014) 14 tablet 0 Completed Course  at Unknown time  . metroNIDAZOLE (METROGEL VAGINAL) 0.75 % vaginal gel Place 1 Applicatorful vaginally at bedtime. For 5 nights (Patient not taking: Reported on 10/29/2014) 70 g 0 Completed Course at Unknown time  . minocycline (MINOCIN,DYNACIN) 100 MG capsule Take 1 capsule (100 mg total) by mouth 2 (two) times daily. (Patient not taking: Reported on 10/29/2014) 14 capsule 0 Not Taking at Unknown time  . naproxen sodium (ANAPROX DS) 550 MG tablet Take 1 tablet (550 mg total) by mouth 2 (two) times daily with a meal. (Patient not taking: Reported on 10/29/2014) 30 tablet 0 Completed Course at Unknown time  . promethazine (PHENERGAN) 25 MG tablet Take 1 tablet (25 mg total) by mouth every 6 (six) hours as needed for nausea or vomiting. (Patient not taking: Reported on 10/29/2014) 30 tablet 0 Completed Course at Unknown time  . traMADol (ULTRAM) 50 MG tablet Take 1 tablet (50 mg total) by mouth every 6 (six) hours as needed. (Patient not taking: Reported on 10/29/2014) 40 tablet 0 Completed Course at Unknown time    Review of Systems  Constitutional: Negative for fever.  Gastrointestinal: Negative for vomiting, abdominal pain, diarrhea and constipation.  Genitourinary: Positive for vaginal discharge. Negative for dysuria, urgency, frequency, hematuria and flank pain.  Musculoskeletal: Negative for back pain.  Neurological: Negative for headaches.   Physical Exam   Blood pressure 118/65, pulse 75, temperature 98.4 F (36.9 C), temperature source Oral, resp. rate 18, height 5\' 3"  (1.6 m), weight 51.88 kg (114 lb 6 oz).  Physical Exam  Nursing note and vitals reviewed. Constitutional: She is oriented to person, place, and time. She appears well-developed and well-nourished. No distress.  HENT:  Head: Normocephalic.  Eyes: Pupils are equal, round, and reactive to light.  Neck: Normal range of motion. Neck supple.  Cardiovascular: Normal rate.   Respiratory: Effort normal.  GI: Soft. She exhibits no  distension. There is tenderness. There is no guarding.  Genitourinary: Vaginal discharge found.  Moderate homogenous white discharge  Musculoskeletal: Normal range of motion.  Neurological: She is alert and oriented to person, place, and time.  Skin: Skin is warm and dry.  Psychiatric: She has a normal mood and affect.    MAU Course  Procedures Results for orders placed or performed during the hospital encounter of 10/29/14 (from the past 24 hour(s))  hCG, quantitative, pregnancy     Status: None   Collection Time: 10/29/14 12:25 PM  Result Value Ref Range   hCG, Beta Chain, Quant, S <1 <5 mIU/mL  Wet prep, genital     Status: Abnormal   Collection Time: 10/29/14  2:20 PM  Result Value Ref Range   Yeast Wet Prep HPF POC NONE SEEN NONE SEEN   Trich, Wet Prep NONE SEEN NONE SEEN   Clue Cells Wet Prep HPF POC MANY (A) NONE SEEN  WBC, Wet Prep HPF POC FEW (A) NONE SEEN     Assessment and Plan   1. BV (bacterial vaginosis)   2. Chronic pelvic pain in female      Medication List    STOP taking these medications        albuterol (2.5 MG/3ML) 0.083% nebulizer solution  Commonly known as:  PROVENTIL     albuterol 108 (90 BASE) MCG/ACT inhaler  Commonly known as:  PROVENTIL HFA;VENTOLIN HFA     ibuprofen 200 MG tablet  Commonly known as:  ADVIL,MOTRIN     ipratropium 0.06 % nasal spray  Commonly known as:  ATROVENT     metroNIDAZOLE 0.75 % vaginal gel  Commonly known as:  METROGEL VAGINAL     minocycline 100 MG capsule  Commonly known as:  MINOCIN,DYNACIN     mometasone-formoterol 100-5 MCG/ACT Aero  Commonly known as:  DULERA     multivitamin with minerals Tabs tablet     naproxen sodium 550 MG tablet  Commonly known as:  ANAPROX DS     promethazine 25 MG tablet  Commonly known as:  PHENERGAN     traMADol 50 MG tablet  Commonly known as:  ULTRAM      TAKE these medications        metroNIDAZOLE 500 MG tablet  Commonly known as:  FLAGYL  Take 1 tablet  (500 mg total) by mouth 2 (two) times daily.       Follow-up Information    Follow up with [facility].   Specialty:  Obstetrics and Gynecology   Contact information:   Anza Nicholson [phone]      Follow up On 11/04/2014.      Westlee Devita 10/29/2014, 2:17 PM

## 2014-10-29 NOTE — MAU Note (Signed)
Pt states has had partial hysterectomy, still has ovaries and fallopian tubes. Has felt off kilter for 2 months, has nipple discharge. Took home upt 2 days ago that was positive. Does have white discharge like yeast after taking medication for bv. Has pain on left side like a pressure. Denies uti s/s.

## 2014-11-17 ENCOUNTER — Telehealth: Payer: Self-pay | Admitting: *Deleted

## 2014-11-17 NOTE — Telephone Encounter (Signed)
Pt left message stating that she was prescribed flagyl in MAU for BV. The Flagyl is making her very sick and she would like to have an alternative  Medication called in for her.

## 2014-11-17 NOTE — Telephone Encounter (Signed)
Called patient, no answer- left message stating we are trying to reach you to return your phone call, please call us back at the clinics 

## 2014-11-18 ENCOUNTER — Telehealth: Payer: Self-pay | Admitting: General Practice

## 2014-11-18 DIAGNOSIS — N760 Acute vaginitis: Principal | ICD-10-CM

## 2014-11-18 DIAGNOSIS — B9689 Other specified bacterial agents as the cause of diseases classified elsewhere: Secondary | ICD-10-CM

## 2014-11-18 MED ORDER — TINIDAZOLE 500 MG PO TABS: 2.0000 g | ORAL_TABLET | Freq: Every day | ORAL | Status: DC

## 2014-11-18 NOTE — Telephone Encounter (Signed)
Patient called and left message stating she is returning our call. Called patient and husband answered stating she wasn't in but was at work. Patient's husband states that she is trying to get another prescription instead of the flagyl because that medicine is making her sick. Told him to let Jane Hanson know we will send in a new prescription to her pharmacy. He verbalized understanding and had no questions

## 2014-12-16 ENCOUNTER — Inpatient Hospital Stay (HOSPITAL_COMMUNITY)
Admission: AD | Admit: 2014-12-16 | Discharge: 2014-12-16 | Disposition: A | Discharge status: Home / Self Care | Payer: Medicaid Other | Source: Ambulatory Visit | Attending: Obstetrics & Gynecology | Admitting: Obstetrics & Gynecology

## 2014-12-16 ENCOUNTER — Encounter (HOSPITAL_COMMUNITY): Payer: Self-pay

## 2014-12-16 DIAGNOSIS — M545 Low back pain: Secondary | ICD-10-CM | POA: Diagnosis not present

## 2014-12-16 DIAGNOSIS — H9202 Otalgia, left ear: Secondary | ICD-10-CM | POA: Diagnosis not present

## 2014-12-16 DIAGNOSIS — N949 Unspecified condition associated with female genital organs and menstrual cycle: Secondary | ICD-10-CM | POA: Diagnosis not present

## 2014-12-16 DIAGNOSIS — Z886 Allergy status to analgesic agent status: Secondary | ICD-10-CM | POA: Insufficient documentation

## 2014-12-16 DIAGNOSIS — Z90710 Acquired absence of both cervix and uterus: Secondary | ICD-10-CM | POA: Insufficient documentation

## 2014-12-16 DIAGNOSIS — G8929 Other chronic pain: Secondary | ICD-10-CM | POA: Insufficient documentation

## 2014-12-16 DIAGNOSIS — N760 Acute vaginitis: Secondary | ICD-10-CM | POA: Diagnosis not present

## 2014-12-16 DIAGNOSIS — R102 Pelvic and perineal pain: Secondary | ICD-10-CM | POA: Diagnosis present

## 2014-12-16 DIAGNOSIS — B9689 Other specified bacterial agents as the cause of diseases classified elsewhere: Secondary | ICD-10-CM

## 2014-12-16 DIAGNOSIS — Z87442 Personal history of urinary calculi: Secondary | ICD-10-CM | POA: Diagnosis not present

## 2014-12-16 LAB — CBC
HEMATOCRIT: 41.3 % (ref 36.0–46.0)
HEMOGLOBIN: 14 g/dL (ref 12.0–15.0)
MCH: 31.1 pg (ref 26.0–34.0)
MCHC: 33.9 g/dL (ref 30.0–36.0)
MCV: 91.8 fL (ref 78.0–100.0)
Platelets: 192 10*3/uL (ref 150–400)
RBC: 4.5 MIL/uL (ref 3.87–5.11)
RDW: 13.6 % (ref 11.5–15.5)
WBC: 5.8 10*3/uL (ref 4.0–10.5)

## 2014-12-16 LAB — URINALYSIS, ROUTINE W REFLEX MICROSCOPIC
BILIRUBIN URINE: NEGATIVE
GLUCOSE, UA: NEGATIVE mg/dL
Hgb urine dipstick: NEGATIVE
Ketones, ur: NEGATIVE mg/dL
LEUKOCYTES UA: NEGATIVE
NITRITE: NEGATIVE
Protein, ur: NEGATIVE mg/dL
Specific Gravity, Urine: 1.015 (ref 1.005–1.030)
Urobilinogen, UA: 1 mg/dL (ref 0.0–1.0)
pH: 7 (ref 5.0–8.0)

## 2014-12-16 LAB — WET PREP, GENITAL
TRICH WET PREP: NONE SEEN
YEAST WET PREP: NONE SEEN

## 2014-12-16 MED ORDER — METRONIDAZOLE 0.75 % VA GEL: 1.0000 | Freq: Every day | VAGINAL | Status: AC

## 2014-12-16 MED ORDER — KETOROLAC TROMETHAMINE 60 MG/2ML IM SOLN
60.0000 mg | Freq: Once | INTRAMUSCULAR | Status: AC
  Administered 2014-12-16: 60 mg via INTRAMUSCULAR
  Filled 2014-12-16: qty 2

## 2014-12-16 NOTE — MAU Note (Addendum)
Pt stated she has had abd pain for about a month and half. Stated she has had her galblader out and an hysterectomy. Reports a dark grey vag discharge [facility] but nothing available until august . Also c/o ear ache x 2 days.

## 2014-12-16 NOTE — MAU Provider Note (Signed)
History     CSN: [id number]  Arrival date and time: 12/16/14 1017   None     Chief Complaint  Patient presents with  . Pelvic Pain   HPI  Jane Hanson is a 29 y.o. female [phone] s/p hysterectomy (without oophorectomy) for fibroids and endometriosis in 2014, appendectomy, and cholecystectomy presenting to the MAU today for abdominal pain x 1.5 months and left ear ache x 2 days.   Abdominal pain has continued since last visit to MAU October 29, 2014. Was treated for BV with Flagyl but that caused vomiting (without alcohol intake), so was switched to another medication (unknown). The "fishy smell" went away, but abdominal pain and discharge continued. Discharge was more of a white/light gray and has since become a really dark gray and worsening. Associated with mild itching and pain on urination only on the outside with the vaginal irritation. Having dyspareunia that is worsening. Especially painful at first entry. Monogamous sexual relationship. Last sexual intercourse "awhile ago because of pain." Does not use condoms due to latex allergy. Requests STD testing. Has no history of STDs. Last GC/Chlamydia test on 08/24/2014 was negative.     Abdominal pain is in bilateral lower quadrants, right worse than left. Described as 8/10 sharp, achy constant pain that will occasionally spike to worsening pain. Feels different than any past pain, such as with endometriosis. Pain does not refer. Has chronic low back pain. Pain worsened with sitting up, walking, running, or hitting a bump in the car. Not worsened with valsalva maneuver. Pain improved with lying down. Motrin did not improve pain. No nausea, vomiting, diarrhea, or constipation. Last BM this morning. BMs have been dark black since antibiotics last month.   Mom has history of cancer and patient is worried her pain may be due to cancer.   Ear ache in left ear for 2 days. Pain described as a throbbing 6/10 pain. No hearing loss or discharge.  Having associated nasal congestion and post nasal drip. No cough. No recent URI, but has history of sinus infections. Uses safety q-tips in ears.     OB History    Gravida Para Term Preterm AB TAB SAB Ectopic Multiple Living   3 3 2 1      3       Past Medical History  Diagnosis Date  . Asthma   . Kidney stone   . Fibroid   . Endometriosis   . Ovarian cyst   . Breast cyst   . Mental disorder   . Bipolar 2 disorder 03/2014  . Schizophrenia 03/2014  . PTSD (post-traumatic stress disorder) 03/2014    Past Surgical History  Procedure Laterality Date  . Cholecystectomy    . Appendectomy    . Abdominal hysterectomy  2014  . Appendectomy      Family History  Problem Relation Age of Onset  . Cancer Mother   . Asthma Father   . Hypertension Mother   . Hypertension Father   . Seizures Maternal Aunt   . Emphysema Father   . Lung disease Father     History  Substance Use Topics  . Smoking status: Never Smoker   . Smokeless tobacco: Never Used  . Alcohol Use: No    Allergies:  Allergies  Allergen Reactions  . Aspirin Rash  . Latex Rash  . Morphine And Related Rash    No prescriptions prior to admission    Review of Systems  Constitutional: Positive for weight loss (6 pounds since 10/29/2014,  unintentional ). Negative for fever, chills, malaise/fatigue and diaphoresis.  HENT: Positive for congestion and ear pain. Negative for sore throat.   Eyes: Negative for blurred vision and redness.  Respiratory: Positive for shortness of breath (chronic, history of asthma). Negative for cough.   Cardiovascular: Positive for chest pain (epigastric, for one week, mild, happens with back pain with deep inspiration). Negative for palpitations and leg swelling.  Gastrointestinal: Positive for heartburn (chronic, stable on medications) and abdominal pain. Negative for nausea, vomiting, diarrhea, constipation and blood in stool.       No decreased appetite.   Genitourinary:  Positive for urgency and frequency. Negative for dysuria (feels a little painful on the outside from vaginal irritation), hematuria and flank pain.  Musculoskeletal: Positive for back pain (chronic).  Skin: Negative for rash.  Neurological: Negative for dizziness, loss of consciousness, weakness and headaches.  Psychiatric/Behavioral:       Been feeling good recently. Had been depressed before   Physical Exam   Blood pressure 101/79, pulse 120, temperature 98 F (36.7 C), temperature source Oral, resp. rate 18, height 5\' 3"  (1.6 m), weight 49.555 kg (109 lb 4 oz), SpO2 100 %.  Physical Exam  Constitutional: She is oriented to person, place, and time. She appears well-developed and well-nourished.  HENT:  Right Ear: External ear normal. No drainage or swelling. No foreign bodies. Tympanic membrane is not perforated, not erythematous and not bulging.  Left Ear: External ear normal. No drainage or swelling. No foreign bodies. Tympanic membrane is not perforated, not erythematous and not bulging.  Mouth/Throat: Oropharynx is clear and moist.  Eyes: Conjunctivae are normal. No scleral icterus.  Neck: Neck supple.  Cardiovascular: Normal rate, regular rhythm and normal heart sounds.   Respiratory: Effort normal and breath sounds normal.  GI: Soft. Bowel sounds are normal. She exhibits no distension and no mass. There is tenderness (RLQ, LLQ, suprapubic). There is no rebound and no guarding.  Genitourinary: There is tenderness on the right labia. There is no rash, lesion or injury on the right labia. There is tenderness on the left labia. There is no rash, lesion or injury on the left labia. Right adnexum displays tenderness. Right adnexum displays no mass and no fullness. Left adnexum displays tenderness. Left adnexum displays no mass and no fullness. There is tenderness (significant tenderness at introitus) in the vagina. No erythema or bleeding in the vagina. No foreign body around the vagina. No  signs of injury around the vagina. Vaginal discharge (thick gray discharge pooled in posterior fornix ) found.  No cervix s/p hysterectomy.   Lymphadenopathy:    She has no cervical adenopathy.  Neurological: She is alert and oriented to person, place, and time.  Skin: Skin is warm and dry.  Psychiatric: She has a normal mood and affect.   Results for orders placed or performed during the hospital encounter of 12/16/14 (from the past 24 hour(s))  Urinalysis, Routine w reflex microscopic (not at [place])     Status: None   Collection Time: 12/16/14 10:29 AM  Result Value Ref Range   Color, Urine YELLOW YELLOW   APPearance CLEAR CLEAR   Specific Gravity, Urine 1.015 1.005 - 1.030   pH 7.0 5.0 - 8.0   Glucose, UA NEGATIVE NEGATIVE mg/dL   Hgb urine dipstick NEGATIVE NEGATIVE   Bilirubin Urine NEGATIVE NEGATIVE   Ketones, ur NEGATIVE NEGATIVE mg/dL   Protein, ur NEGATIVE NEGATIVE mg/dL   Urobilinogen, UA 1.0 0.0 - 1.0 mg/dL   Nitrite  NEGATIVE NEGATIVE   Leukocytes, UA NEGATIVE NEGATIVE  CBC     Status: None   Collection Time: 12/16/14 11:28 AM  Result Value Ref Range   WBC 5.8 4.0 - 10.5 K/uL   RBC 4.50 3.87 - 5.11 MIL/uL   Hemoglobin 14.0 12.0 - 15.0 g/dL   HCT 41.3 36.0 - 46.0 %   MCV 91.8 78.0 - 100.0 fL   MCH 31.1 26.0 - 34.0 pg   MCHC 33.9 30.0 - 36.0 g/dL   RDW 13.6 11.5 - 15.5 %   Platelets 192 150 - 400 K/uL  Wet prep, genital     Status: Abnormal   Collection Time: 12/16/14 12:16 PM  Result Value Ref Range   Yeast Wet Prep HPF POC NONE SEEN NONE SEEN   Trich, Wet Prep NONE SEEN NONE SEEN   Clue Cells Wet Prep HPF POC FEW (A) NONE SEEN   WBC, Wet Prep HPF POC FEW (A) NONE SEEN   MAU Course  Procedures None  MDM UA- normal  CBC- normal Wet Prep- few clue cells and few WBC GC/Chlamydia- awaiting results HIV antibody-awaiting results Toradol 60 mg IM x 1 dose for pain after pelvic exam; patient states significant relief.   Of Note: Pelvic US and transvaginal US,  non-OB from 08/24/2014 had following impression: Normal appearing ovaries. No mass lesions or free fluid. Active peristalsis in multiple small bowel loops in the pelvis.    Assessment and Plan  A:  1. Chronic pelvic pain in female   2. BV (bacterial vaginosis)   3. Ear pain, left     P:   Chronic pelvic pain: improved with Toradol. Has been seen multiple times in MAU and ED for same symptoms. S/P hysterectomy for endometriosis and fibroids. Past pelvic US in 07/2014 was normal. Treating for BV and awaiting STD results. Requested pain medication. Take Ibuprofen 600-800 mg 3-4 times per day prn for pain (max dose 2400 mg daily). May benefit from management of chronic pain elsewhere- has been referred to pain clinic in the past, but says she never got a call. Encouraged to get referral after resuming PCP care. Patient is established in the [facility] and is encouraged to follow up there as needed. Return to MAU for emergencies.   Bacterial vaginosis: previously treated with Flagyl and an unknown medication following visit 10/29/2014; continuing discharge. Rx metronidazole gel 5.90% 1 applicator full at bedtime. Encourage appropriate vaginal hygiene.   Left ear pain: does not appear infected. Antibiotics not indicated. Likely due to nasal congestion. Suggest use of Flonase that patient has at home. Stay hydrated to help thin out mucus.    Encourage establishment of PCP and follow up in the [facility]. Contact information for possible providers given.  Lezlie Lye, NP 12/16/2014 1:58 PM

## 2014-12-17 LAB — HIV ANTIBODY (ROUTINE TESTING W REFLEX): HIV Screen 4th Generation wRfx: NONREACTIVE

## 2014-12-17 LAB — GC/CHLAMYDIA PROBE AMP (~~LOC~~) NOT AT ARMC
Chlamydia: NEGATIVE
NEISSERIA GONORRHEA: NEGATIVE

## 2015-08-20 IMAGING — US US RENAL
1 series · 14 of 25 positions shown · non-contrast
Comparison: None.

CLINICAL DATA: Left flank pain.

EXAM:
RENAL/URINARY TRACT ULTRASOUND COMPLETE

[Series 1: us renal · 0.17mm/px · 14 of 30 slices shown]
[im 1/30]
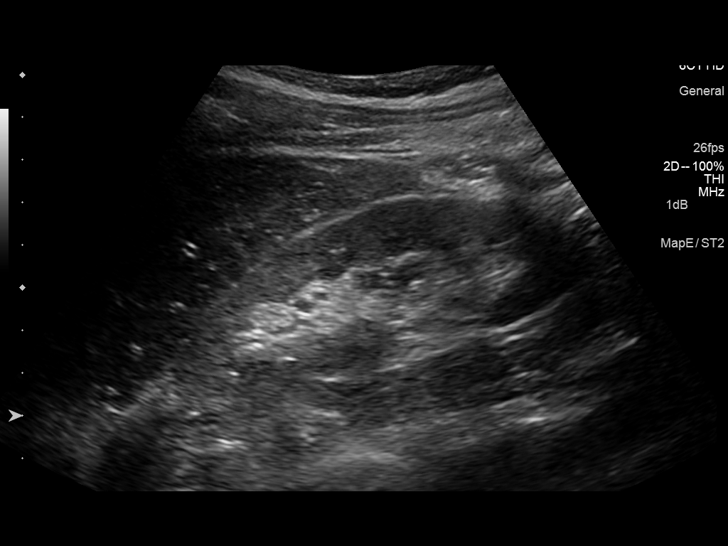
[im 3/30]
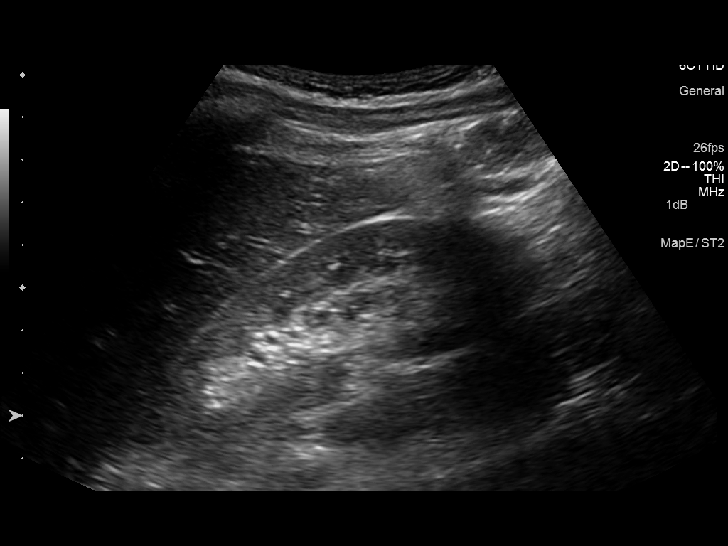
[im 5/30]
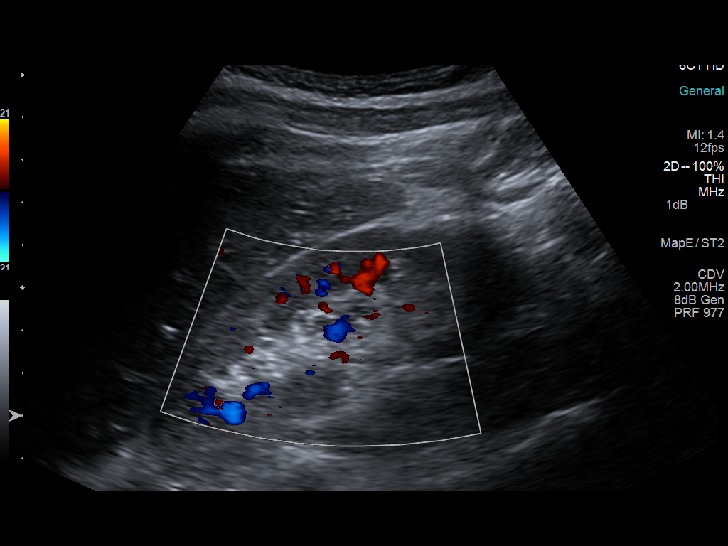
[im 8/30]
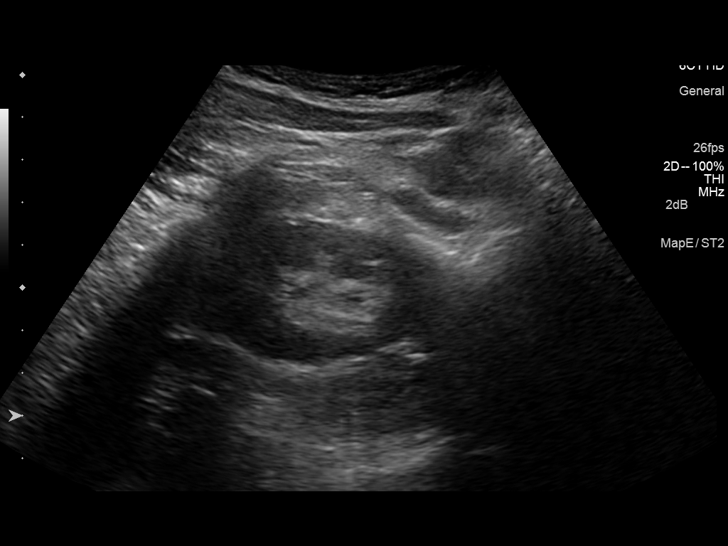
[im 10/30]
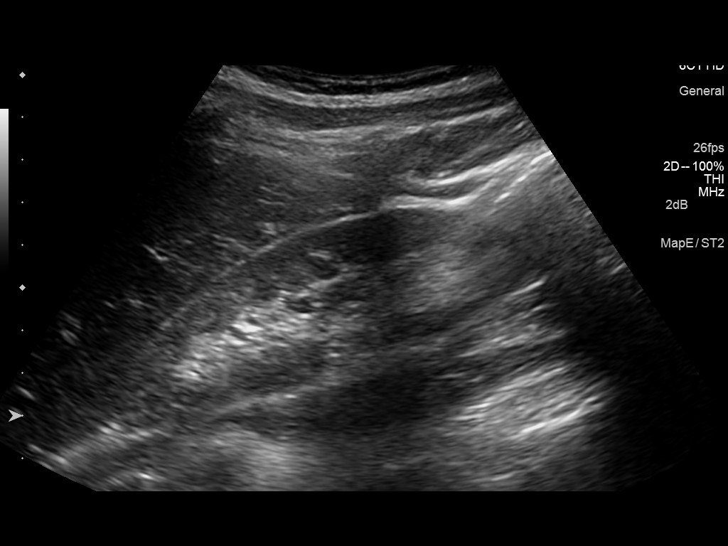
[im 11/30]
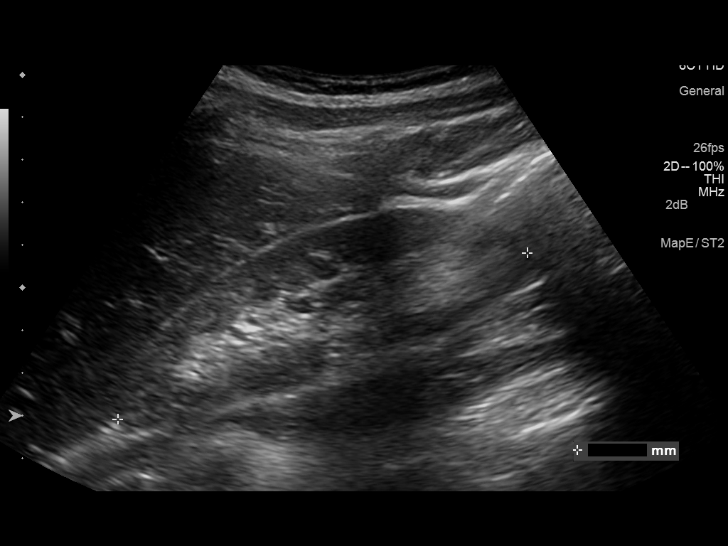
[im 14/30]
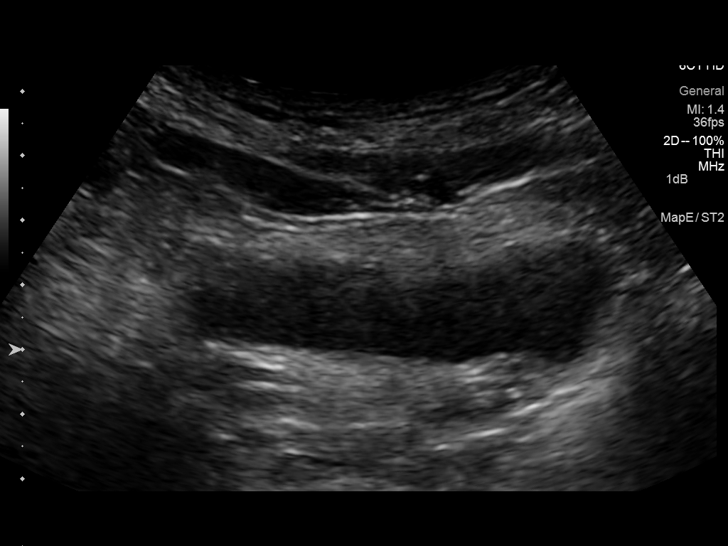
[im 16/30]
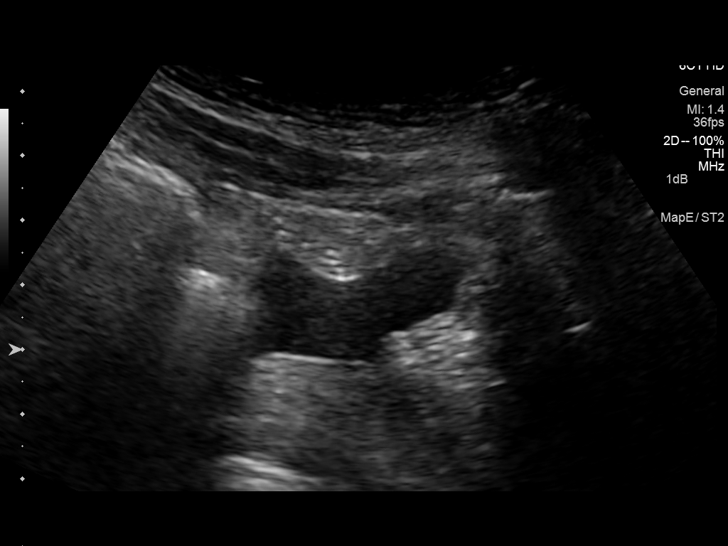
[im 19/30]
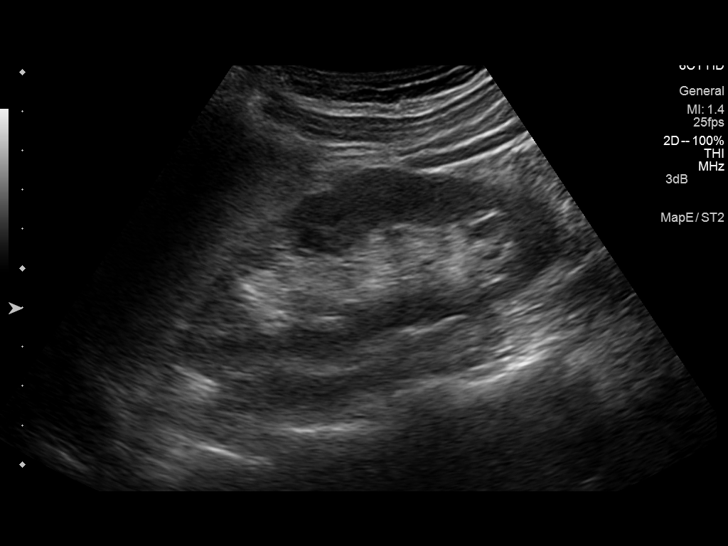
[im 20/30]
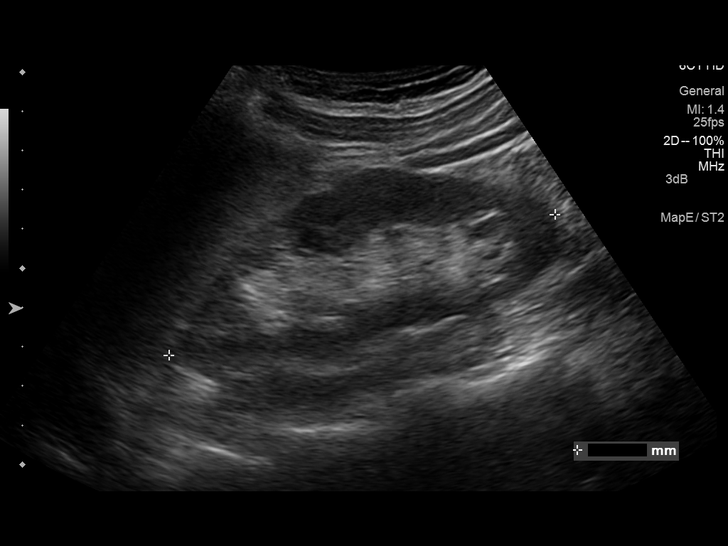
[im 22/30]
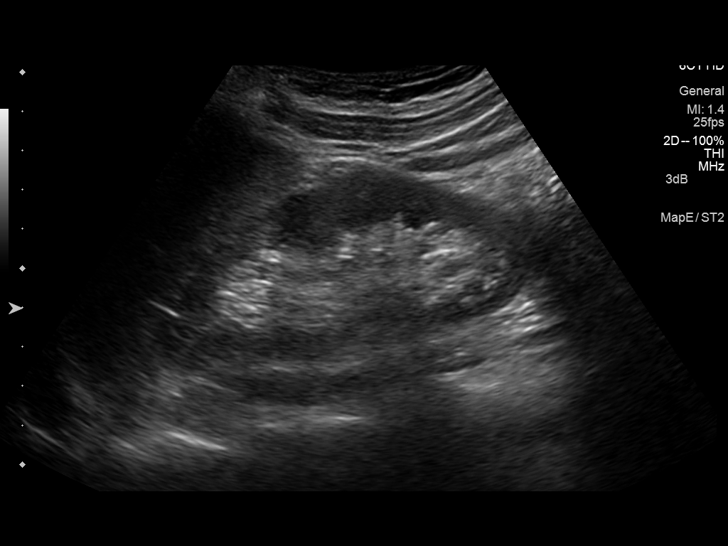
[im 25/30]
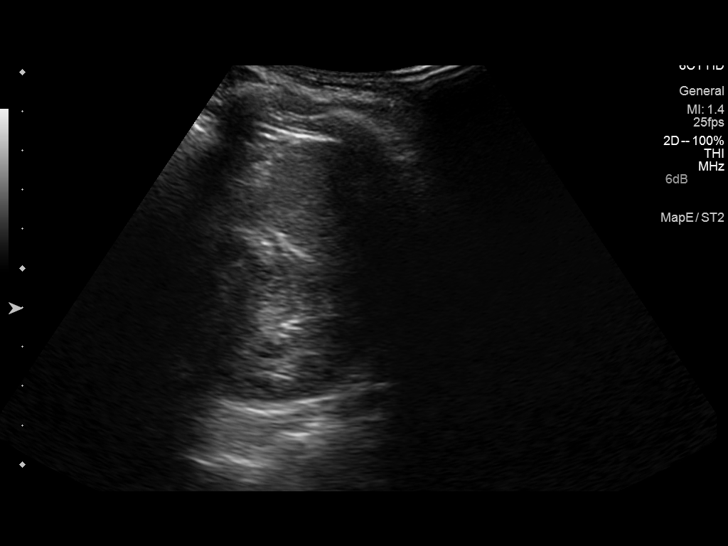
[im 27/30]
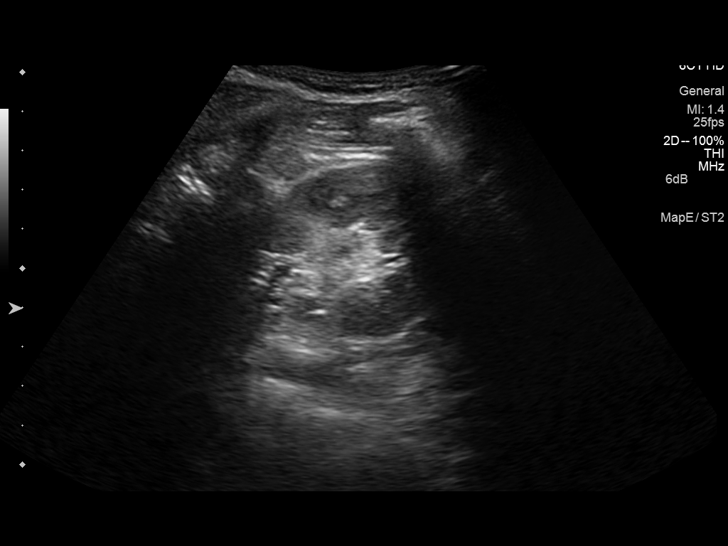
[im 30/30]
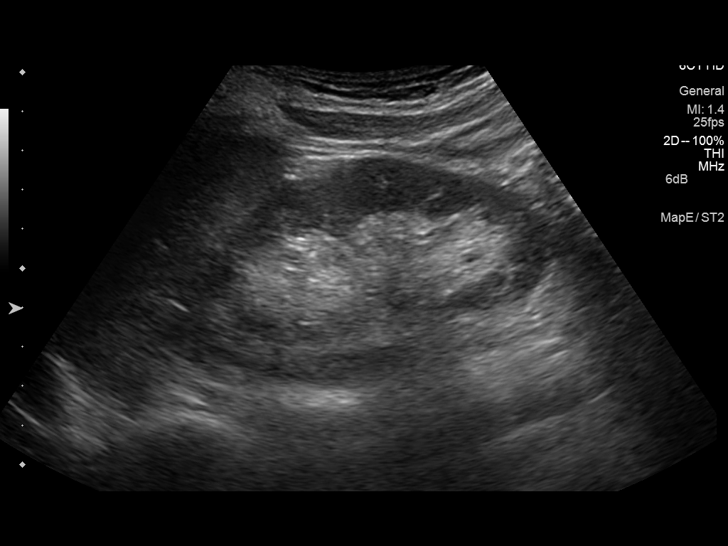

[14 of 25 positions shown; findings below may reference images not displayed]

FINDINGS: Right Kidney:

Length: 10.4 cm. Echogenicity within normal limits. No mass or
hydronephrosis visualized.

Left Kidney:

Length: 10.5 cm. Echogenicity within normal limits. No mass or
hydronephrosis visualized.

Bladder:

Appears normal for degree of bladder distention.
IMPRESSION: Normal exam.

## 2016-01-20 IMAGING — US US PELVIS COMPLETE
1 series · 14 of 25 positions shown · non-contrast
Comparison: 09/02/2013

CLINICAL DATA: Lower abdominal pain.



[Series 1: us pelvis complete · 26 acquisitions, 14 frames shown]
[im 1/26]
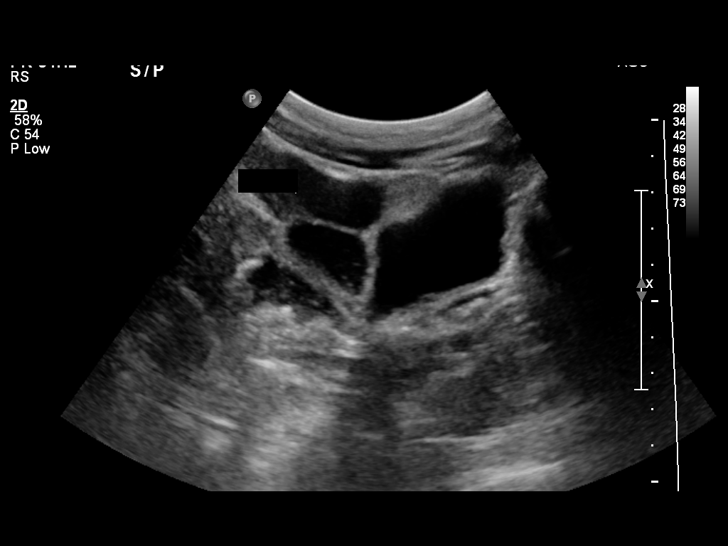
[im 3/26]
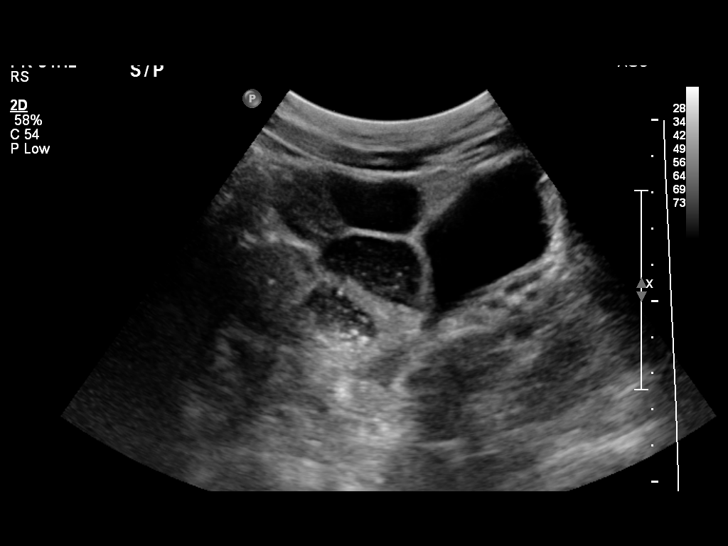
[im 5/26]
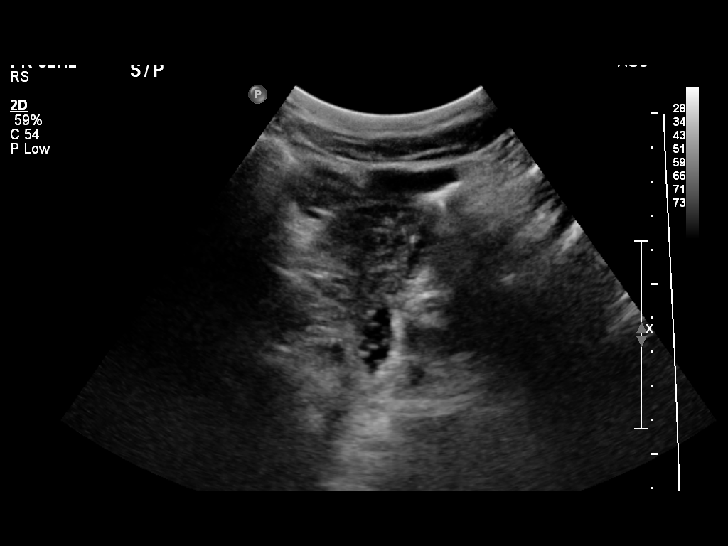
[im 7/26]
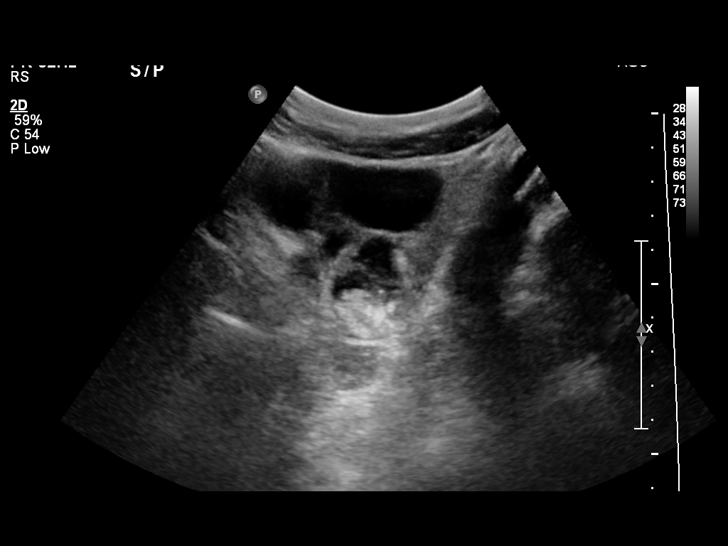
[im 9/26]
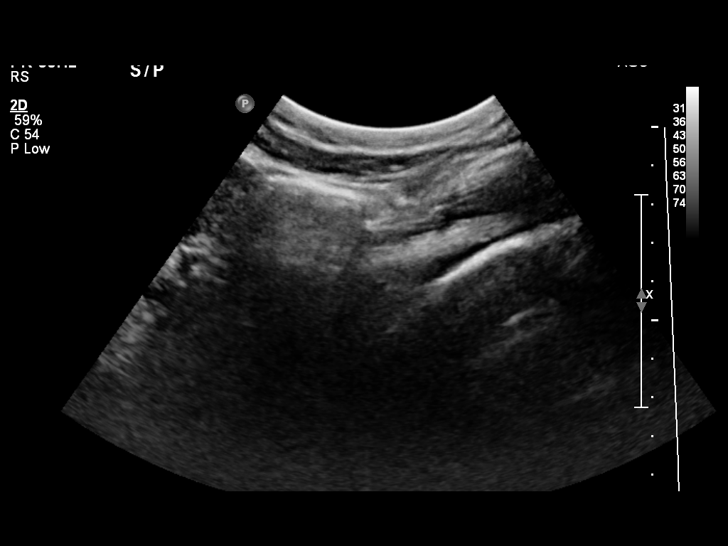
[im 10/26]
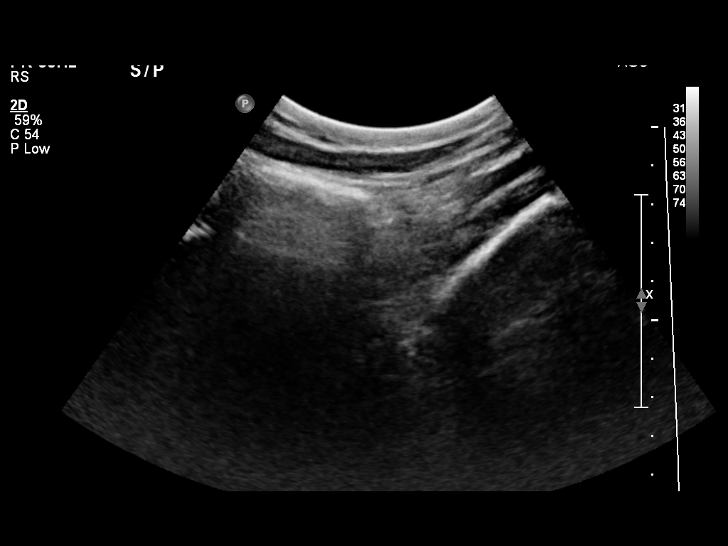
[im 12/26]
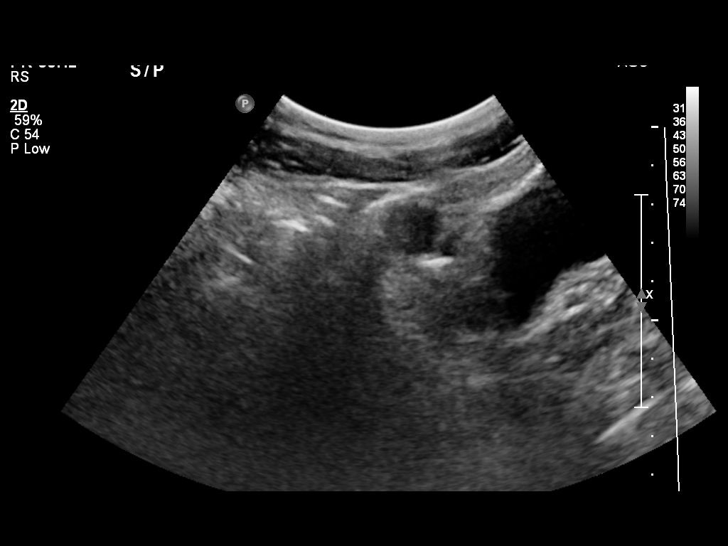
[im 14/26]
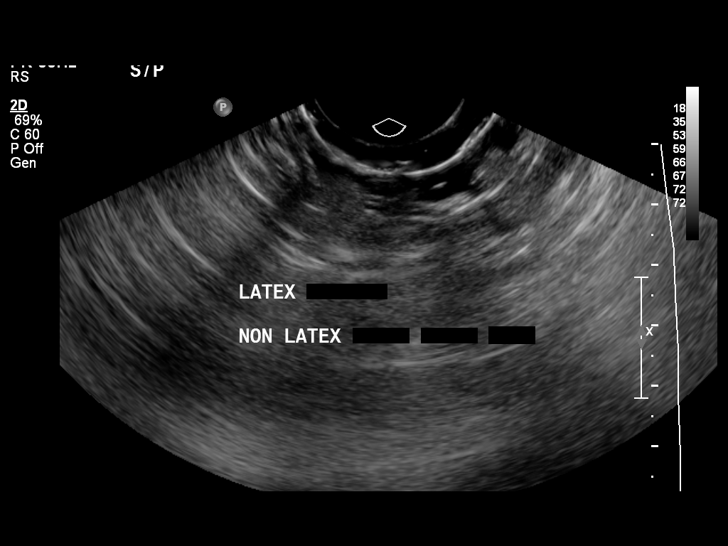
[im 16/26]
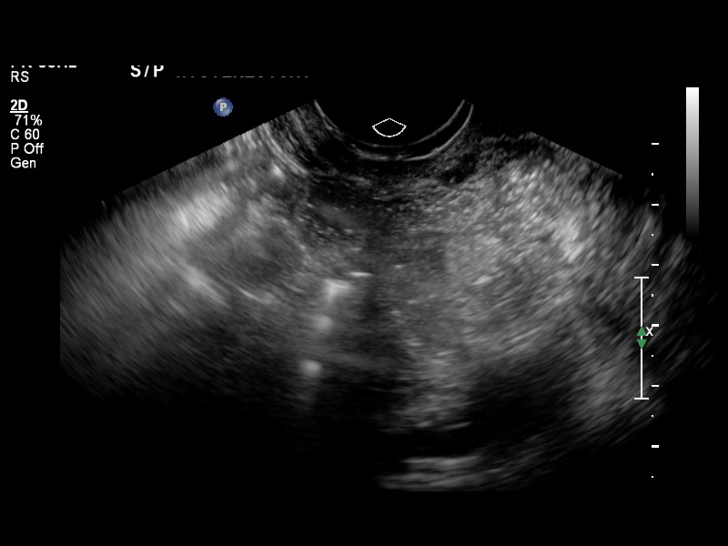
[im 17/26]
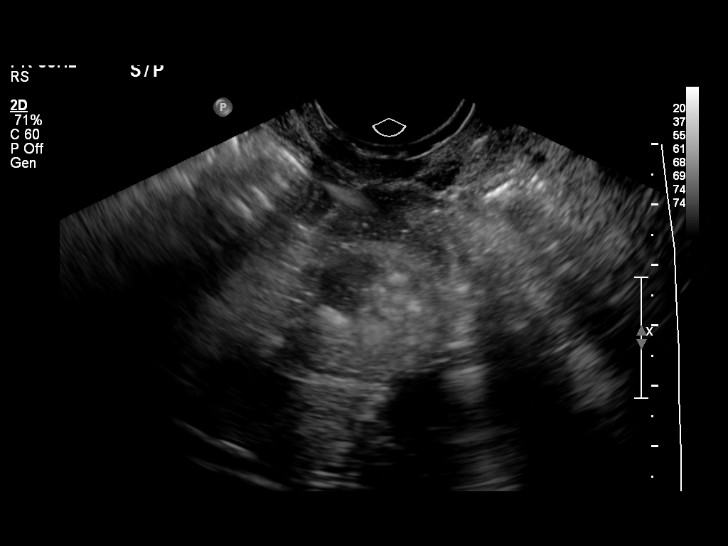
[im 19/26]
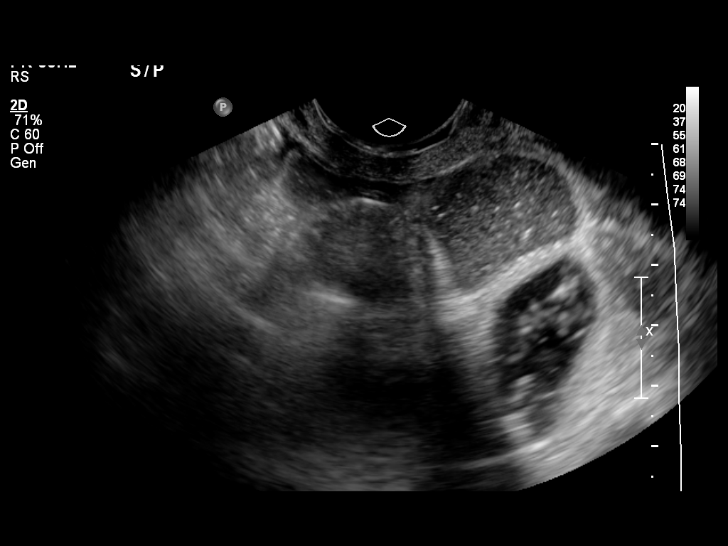
[im 21/26]
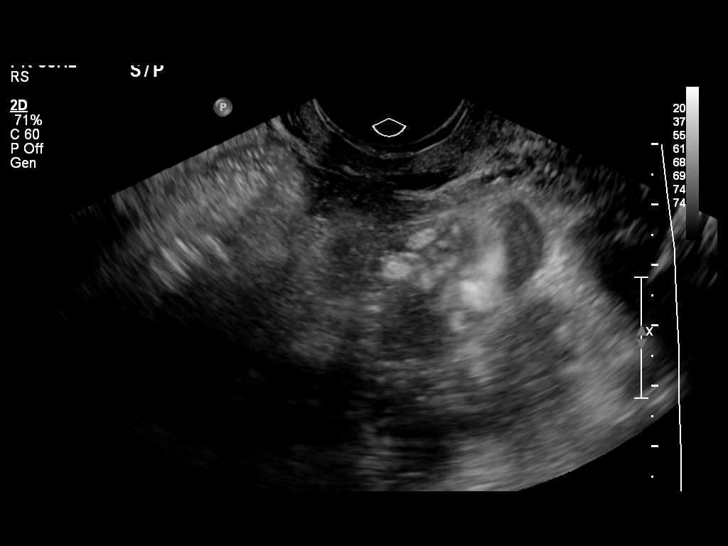
[im 23/26]
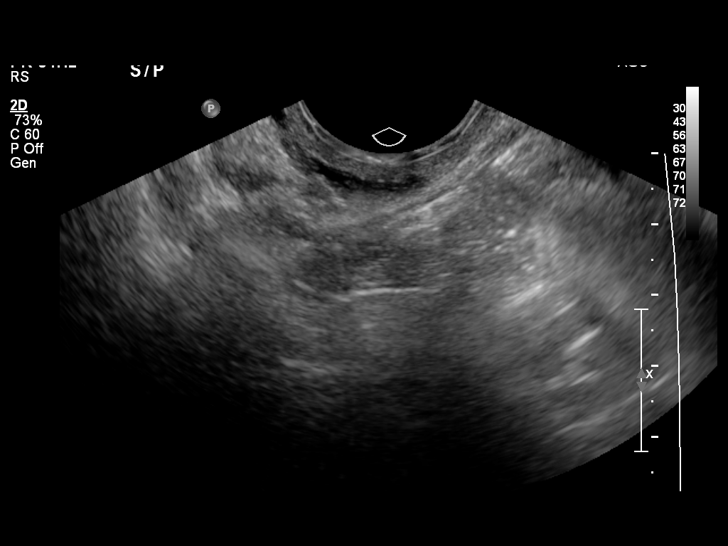
[im 26/26]
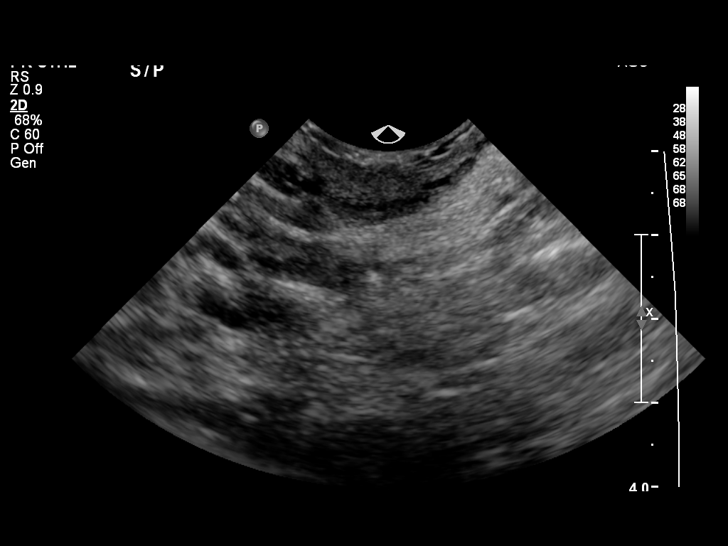

[14 of 25 positions shown; findings below may reference images not displayed]

FINDINGS: Uterus

Removed.

Right ovary

Measurements: 3.2 x 2.4 x 2.0 cm. Normal appearance/no adnexal mass.

Left ovary

Measurements: 2.7 x 1.8 x 2.2 cm. Normal appearance/no adnexal mass.

Other findings

No free fluid.
IMPRESSION: Normal appearing ovaries. No mass lesions or free fluid. Active
peristalsis in multiple small bowel loops in the pelvis.

## 2016-07-04 ENCOUNTER — Emergency Department: Admit: 2016-07-04 | Payer: Self-pay | Primary: Family Medicine

## 2016-07-04 ENCOUNTER — Inpatient Hospital Stay
Admit: 2016-07-04 | Discharge: 2016-07-04 | Disposition: A | Discharge status: Home / Self Care | Payer: Self-pay | Attending: Emergency Medicine

## 2016-07-04 DIAGNOSIS — J4520 Mild intermittent asthma, uncomplicated: Secondary | ICD-10-CM

## 2016-07-04 MED ORDER — PREDNISONE 10 MG TABLETS IN A DOSE PACK
10 mg | ORAL_TABLET | ORAL | 0 refills | Status: DC
Start: 2016-07-04 — End: 2016-09-20

## 2016-07-04 MED ORDER — ALBUTEROL SULFATE HFA 90 MCG/ACTUATION AEROSOL INHALER
90 mcg/actuation | RESPIRATORY_TRACT | 0 refills | Status: AC | PRN
Start: 2016-07-04 — End: ?

## 2016-07-04 MED ORDER — IPRATROPIUM-ALBUTEROL 2.5 MG-0.5 MG/3 ML NEB SOLUTION
2.5 mg-0.5 mg/3 ml | RESPIRATORY_TRACT | Status: AC
Start: 2016-07-04 — End: 2016-07-04
  Administered 2016-07-04: 19:00:00 via RESPIRATORY_TRACT

## 2016-07-04 MED ORDER — IBUPROFEN 800 MG TAB
800 mg | ORAL_TABLET | Freq: Four times a day (QID) | ORAL | 0 refills | Status: AC | PRN
Start: 2016-07-04 — End: 2016-07-11

## 2016-07-04 MED FILL — IPRATROPIUM-ALBUTEROL 2.5 MG-0.5 MG/3 ML NEB SOLUTION: 2.5 mg-0.5 mg/3 ml | RESPIRATORY_TRACT | Qty: 3

## 2016-07-04 NOTE — ED Notes (Signed)
I have reviewed discharge instructions with the patient.  The patient verbalized understanding. Discharge medications reviewed with patient and appropriate educational materials and side effects teaching were provided.

## 2016-07-04 NOTE — ED Provider Notes (Signed)
EMERGENCY DEPARTMENT HISTORY AND PHYSICAL EXAM    3:01 PM      Date: 07/04/2016  Patient Name: Jennifer Nixon    History of Presenting Illness     Chief Complaint   Patient presents with   ??? Wheezing         History Provided By: Patient    Chief Complaint: cough, wheezing, intermittent chest pain  Duration:  Days (2)  Timing:  Acute  Location: respiratory  Quality: Aching  Severity: Moderate  Modifying Factors: she reports has used her albuterol inhaler twice with moderate relief.  Associated Symptoms: denies any other associated signs or symptoms      Additional History (Context): Jennifer Nixon is a 31 y.o. female with a PMHX Asthma arrived to ER c/o cough, wheezing, and intermittent chest pain x2 days. She reports has used her inhaler twice a day with moderate relief. Describes chest pain as aching discomfort only when coughing. Denies any recent travels or use of B-control pills.  Denies any recent surgeries or hospitalizations within the past two months. Denies fever, chills, headache, dizziness, palpitations, SOB at rest or on exertion, abdominal pain, N/V/D, flank pain, urinary sx's, or any other concerns.   PCP: None    Current Outpatient Prescriptions   Medication Sig Dispense Refill   ??? albuterol (PROVENTIL HFA, VENTOLIN HFA, PROAIR HFA) 90 mcg/actuation inhaler Take 2 Puffs by inhalation every four (4) hours as needed for Wheezing. 1 Inhaler 0   ??? [START ON 07/05/2016] predniSONE (STERAPRED DS) 10 mg dose pack Take as directed until completion 21 Tab 0   ??? ibuprofen (MOTRIN) 800 mg tablet Take 1 Tab by mouth every six (6) hours as needed for Pain for up to 7 days. 20 Tab 0       Past History     Past Medical History:  Past Medical History:   Diagnosis Date   ??? Asthma        Past Surgical History:  History reviewed. No pertinent surgical history.    Family History:  History reviewed. No pertinent family history.    Social History:  Social History   Substance Use Topics   ??? Smoking status: None   ???  Smokeless tobacco: None   ??? Alcohol use None       Allergies:  No Known Allergies      Review of Systems       Review of Systems   Constitutional: Negative.  Negative for activity change, appetite change, chills, diaphoresis, fatigue, fever and unexpected weight change.   Respiratory: Positive for cough and wheezing.    Cardiovascular: Negative for palpitations and leg swelling.        C/o chest tightness   Gastrointestinal: Negative.  Negative for abdominal distention, abdominal pain, anal bleeding, blood in stool, constipation, diarrhea, nausea, rectal pain and vomiting.   Skin: Negative.    Neurological: Negative for dizziness, weakness, numbness and headaches.   All other systems reviewed and are negative.        Physical Exam     Visit Vitals   ??? BP 114/80 (BP 1 Location: Right arm, BP Patient Position: At rest)   ??? Pulse 88   ??? Temp 98.2 ??F (36.8 ??C)   ??? Resp 16   ??? Ht 5\' 3"  (1.6 m)   ??? Wt 54.4 kg (120 lb)   ??? LMP Comment: hysterectomy 2014   ??? SpO2 100%   ??? BMI 21.26 kg/m2  Physical Exam   Constitutional: She is oriented to person, place, and time. She appears well-developed and well-nourished. No distress.   HENT:   Right Ear: Hearing, tympanic membrane, external ear and ear canal normal.   Left Ear: Hearing, tympanic membrane, external ear and ear canal normal.   Nose: Nose normal.   Mouth/Throat: Uvula is midline, oropharynx is clear and moist and mucous membranes are normal. No oropharyngeal exudate.   Cardiovascular: Normal rate, regular rhythm and normal heart sounds.  Exam reveals no gallop and no friction rub.    No murmur heard.  Pulmonary/Chest: Effort normal. No accessory muscle usage. No respiratory distress. She has no decreased breath sounds. She has wheezes. She has no rhonchi. She has no rales. She exhibits no tenderness and no bony tenderness.   Scattered inspiratory and expiratory wheezes to all lobes.  Patient speaking in full sentences with no distress   Abdominal: Soft. Bowel  sounds are normal. She exhibits no distension and no mass. There is no tenderness. There is no rebound and no guarding.   Musculoskeletal: Normal range of motion.   Neurological: She is alert and oriented to person, place, and time.   Skin: Skin is warm and dry. She is not diaphoretic.   Psychiatric: She has a normal mood and affect. Her behavior is normal. Judgment and thought content normal.   Nursing note and vitals reviewed.        Diagnostic Study Results     Labs -  Recent Results (from the past 12 hour(s))   EKG, 12 LEAD, INITIAL    Collection Time: 07/04/16  1:56 PM   Result Value Ref Range    Ventricular Rate 73 BPM    Atrial Rate 73 BPM    P-R Interval 136 ms    QRS Duration 80 ms    Q-T Interval 360 ms    QTC Calculation (Bezet) 396 ms    Calculated P Axis 65 degrees    Calculated R Axis 76 degrees    Calculated T Axis 55 degrees    Diagnosis       Sinus rhythm with marked sinus arrhythmia  Otherwise normal ECG  No previous ECGs available         Radiologic Studies -   XR CHEST PA LAT   Final Result      IMPRESSION: No acute radiographic cardiopulmonary abnormality.      Medical Decision Making   I am the first provider for this patient.    I reviewed the vital signs, available nursing notes, past medical history, past surgical history, family history and social history.    Vital Signs-Reviewed the patient's vital signs.    Records Reviewed: Old Medical Records (Time of Review: 3:01 PM)    ED Course: Progress Notes, Reevaluation, and Consults:  1450:  I have re-examined patient. No further wheezing.  Provider Notes (Medical Decision Making):   DDX:  1.URI  Bronchitis  Pneumonia  Asthma exacerbation  Influenza    2.  ACS  MI  Pericarditis  Aortic Dissection  Pulmonary Hypertension  Pulmonary Embolism  Pneumonia  Viral Pleuritis  GERD  Costochondritis  Anxiety or Panic Disorder    Clinical Impression/plan: Patient stable condition.  Reviewed chest x-ray with patient. Answered questions. Will prescribe  Albuterol inhaler, prednisone, and NSAID. Follow up with PCP in 2-4 days.  If symptoms worsen or have any other concerns, return to ER. Patient verbalizes d/c instructions.     Diagnosis     Clinical  Impression:   1. Mild intermittent asthma without complication    2. Musculoskeletal chest pain        Disposition: Home    Follow-up Information     Follow up With Details Comments Contact Info    [facility] Schedule an appointment as soon as possible for a visit in 2 days  [address]  [address]  [place] [address]  [phone]    Return to ER immediately for any worsening symptoms or concerns.               Patient's Medications   Start Taking    ALBUTEROL (PROVENTIL HFA, VENTOLIN HFA, PROAIR HFA) 90 MCG/ACTUATION INHALER    Take 2 Puffs by inhalation every four (4) hours as needed for Wheezing.    IBUPROFEN (MOTRIN) 800 MG TABLET    Take 1 Tab by mouth every six (6) hours as needed for Pain for up to 7 days.    PREDNISONE (STERAPRED DS) 10 MG DOSE PACK    Take as directed until completion   Continue Taking    No medications on file   These Medications have changed    No medications on file   Stop Taking    No medications on file     _______________________________    Attestations:  Scribe Attestation     Charlott Rakes, NP acting as a scribe for and in the presence of Grace Blight, DO      July 04, 2016 at 3:12 PM       Provider Attestation:      I personally performed the services described in the documentation, reviewed the documentation, as recorded by the scribe in my presence, and it accurately and completely records my words and actions. July 04, 2016 at 3:12 PM - Grace Blight, DO    _______________________________

## 2016-07-04 NOTE — ED Triage Notes (Signed)
Pt states started wheezing about 2 days ago, pt with hx of asthma.

## 2016-07-05 LAB — EKG 12-LEAD
Atrial Rate: 73 {beats}/min
P Axis: 65 degrees
P-R Interval: 136 ms
Q-T Interval: 360 ms
QRS Duration: 80 ms
QTc Calculation (Bazett): 396 ms
R Axis: 76 degrees
T Axis: 55 degrees
Ventricular Rate: 73 {beats}/min

## 2016-07-05 LAB — EKG, 12 LEAD, INITIAL
Atrial Rate: 73 {beats}/min
Calculated P Axis: 65 degrees
Calculated R Axis: 76 degrees
Calculated T Axis: 55 degrees
P-R Interval: 136 ms
Q-T Interval: 360 ms
QRS Duration: 80 ms
QTC Calculation (Bezet): 396 ms
Ventricular Rate: 73 {beats}/min

## 2016-09-20 ENCOUNTER — Inpatient Hospital Stay
Admit: 2016-09-20 | Discharge: 2016-09-21 | Disposition: A | Discharge status: Home / Self Care | Payer: MEDICAID | Attending: Emergency Medicine

## 2016-09-20 DIAGNOSIS — N730 Acute parametritis and pelvic cellulitis: Secondary | ICD-10-CM

## 2016-09-20 NOTE — ED Triage Notes (Signed)
C/o "flu like symptoms"  States she has had a fever and burning up for 1 day.  States she is taking doxy for chlamydia, pid and bronchitis

## 2016-09-20 NOTE — ED Provider Notes (Signed)
HPI Comments: Jennifer Nixon is a 31 y.o. Female who was recently treated for pid at [facility] given rocephin, placed on doxy, but has been unable to keep it down due to gi upset. C/o dysuria, urgency, lower abd cramping. No new vag d/c, bleeding. No fever documented, diarrhea. Sx worse with po intake. Not relieved with home meds. H/o pid in remote past    The history is provided by the patient.        Past Medical History:   Diagnosis Date   ??? Asthma        History reviewed. No pertinent surgical history.      History reviewed. No pertinent family history.    Social History     Social History   ??? Marital status: SINGLE     Spouse name: N/A   ??? Number of children: N/A   ??? Years of education: N/A     Occupational History   ??? Not on file.     Social History Main Topics   ??? Smoking status: Never Smoker   ??? Smokeless tobacco: Never Used   ??? Alcohol use No   ??? Drug use: No   ??? Sexual activity: Not on file     Other Topics Concern   ??? Not on file     Social History Narrative         ALLERGIES: Review of patient's allergies indicates no known allergies.    Review of Systems   Constitutional: Negative for fever.   HENT: Negative for sore throat and trouble swallowing.    Eyes: Negative for visual disturbance.   Respiratory: Negative for cough and shortness of breath.    Gastrointestinal: Positive for abdominal pain.   Endocrine: Negative for polyuria.   Genitourinary: Positive for dysuria and urgency. Negative for vaginal pain.   Musculoskeletal: Negative for gait problem.   Allergic/Immunologic: Negative for immunocompromised state.   Neurological: Negative for seizures and syncope.   Psychiatric/Behavioral: Positive for sleep disturbance.       Vitals:    09/20/16 1935 09/20/16 2030 09/20/16 2045 09/20/16 2100   BP: 113/82 110/73 114/70 106/68   Pulse: 73      Resp: 16      Temp: 98 ??F (36.7 ??C)      SpO2: 100% 100% 100% 100%   Weight: 53.1 kg (117 lb)               Physical Exam   Constitutional: She is oriented to  person, place, and time. She appears well-developed and well-nourished.  Non-toxic appearance. She does not have a sickly appearance. She does not appear ill. No distress.   HENT:   Head: Normocephalic and atraumatic.   Right Ear: External ear normal.   Left Ear: External ear normal.   Nose: Nose normal.   Mouth/Throat: Uvula is midline, oropharynx is clear and moist and mucous membranes are normal.   Eyes: Conjunctivae are normal. No scleral icterus.   Neck: Neck supple.   Cardiovascular: Normal rate, regular rhythm, normal heart sounds and intact distal pulses.    Pulmonary/Chest: Effort normal and breath sounds normal.   Abdominal: Soft. Normal appearance. She exhibits no distension and no mass. There is no hepatosplenomegaly. There is tenderness. There is no rigidity, no rebound, no guarding and no CVA tenderness.       Musculoskeletal: She exhibits no edema.   Neurological: She is alert and oriented to person, place, and time. Gait normal.   Skin: Skin is warm and  dry. She is not diaphoretic.   Psychiatric: Her behavior is normal.   Nursing note and vitals reviewed.       MDM      ED Course       Procedures    Vitals:  Patient Vitals for the past 12 hrs:   Temp Pulse Resp BP SpO2   09/20/16 2100 - - - 106/68 100 %   09/20/16 2045 - - - 114/70 100 %   09/20/16 2030 - - - 110/73 100 %   09/20/16 1935 98 ??F (36.7 ??C) 73 16 113/82 100 %         Medications ordered:   Medications   sodium chloride 0.9 % bolus infusion 1,000 mL (0 mL IntraVENous IV Completed 09/20/16 2115)   ketorolac (TORADOL) injection 15 mg (15 mg IntraVENous Given 09/20/16 2030)   ondansetron (ZOFRAN) injection 4 mg (4 mg IntraVENous Given 09/20/16 2030)   famotidine (PF) (PEPCID) injection 20 mg (20 mg IntraVENous Given 09/20/16 2030)   cefTRIAXone (ROCEPHIN) 1 g in 0.9% sodium chloride (MBP/ADV) 50 mL MBP (0 g IntraVENous IV Completed 09/20/16 2102)   azithromycin (ZITHROMAX) tablet 1,000 mg (1,000 mg Oral Given 09/20/16 2121)         Lab findings:   Recent Results (from the past 12 hour(s))   CBC WITH AUTOMATED DIFF    Collection Time: 09/20/16  8:15 PM   Result Value Ref Range    WBC 6.6 4.6 - 13.2 K/uL    RBC 4.58 4.20 - 5.30 M/uL    HGB 14.3 12.0 - 16.0 g/dL    HCT 41.9 35.0 - 45.0 %    MCV 91.5 74.0 - 97.0 FL    MCH 31.2 24.0 - 34.0 PG    MCHC 34.1 31.0 - 37.0 g/dL    RDW 12.7 11.6 - 14.5 %    PLATELET 229 135 - 420 K/uL    MPV 10.2 9.2 - 11.8 FL    NEUTROPHILS 43 40 - 73 %    LYMPHOCYTES 47 21 - 52 %    MONOCYTES 8 3 - 10 %    EOSINOPHILS 1 0 - 5 %    BASOPHILS 1 0 - 2 %    ABS. NEUTROPHILS 2.8 1.8 - 8.0 K/UL    ABS. LYMPHOCYTES 3.1 0.9 - 3.6 K/UL    ABS. MONOCYTES 0.5 0.05 - 1.2 K/UL    ABS. EOSINOPHILS 0.0 0.0 - 0.4 K/UL    ABS. BASOPHILS 0.1 (H) 0.0 - 0.06 K/UL    DF AUTOMATED     METABOLIC PANEL, COMPREHENSIVE    Collection Time: 09/20/16  8:15 PM   Result Value Ref Range    Sodium 142 136 - 145 mmol/L    Potassium 4.0 3.5 - 5.5 mmol/L    Chloride 108 100 - 108 mmol/L    CO2 28 21 - 32 mmol/L    Anion gap 6 3.0 - 18 mmol/L    Glucose 90 74 - 99 mg/dL    BUN 13 7.0 - 18 MG/DL    Creatinine 0.93 0.6 - 1.3 MG/DL    BUN/Creatinine ratio 14 12 - 20      GFR est AA >60 >60 ml/min/1.5m    GFR est non-AA >60 >60 ml/min/1.730m   Calcium 8.8 8.5 - 10.1 MG/DL    Bilirubin, total 0.5 0.2 - 1.0 MG/DL    ALT (SGPT) 15 13 - 56 U/L    AST (SGOT) 14 (L) 15 - 37  U/L    Alk. phosphatase 62 45 - 117 U/L    Protein, total 6.7 6.4 - 8.2 g/dL    Albumin 4.3 3.4 - 5.0 g/dL    Globulin 2.4 2.0 - 4.0 g/dL    A-G Ratio 1.8 (H) 0.8 - 1.7     HCG QL SERUM    Collection Time: 09/20/16  8:15 PM   Result Value Ref Range    HCG, Ql. NEGATIVE  NEG     LIPASE    Collection Time: 09/20/16  8:15 PM   Result Value Ref Range    Lipase 85 73 - 393 U/L   URINALYSIS W/ RFLX MICROSCOPIC    Collection Time: 09/20/16  9:15 PM   Result Value Ref Range    Color YELLOW      Appearance CLEAR      Specific gravity 1.024 1.005 - 1.030      pH (UA) 6.0 5.0 - 8.0      Protein NEGATIVE  NEG mg/dL     Glucose NEGATIVE  NEG mg/dL    Ketone TRACE (A) NEG mg/dL    Bilirubin NEGATIVE  NEG      Blood NEGATIVE  NEG      Urobilinogen 0.2 0.2 - 1.0 EU/dL    Nitrites NEGATIVE  NEG      Leukocyte Esterase TRACE (A) NEG     URINE MICROSCOPIC ONLY    Collection Time: 09/20/16  9:15 PM   Result Value Ref Range    WBC 0 to 3 0 - 4 /hpf    RBC 0 0 - 5 /hpf    Epithelial cells FEW 0 - 5 /lpf    Bacteria NEGATIVE  NEG /hpf       EKG interpretation by ED Physician:      X-Ray, CT or other radiology findings or impressions:  No orders to display       Progress notes, Consult notes or additional Procedure notes:   Doubt need for other imaging. Pt intolerant of doxy. Given dose of rocephin here, along with po zithromax and for her to repeat in a week  Doubt toa, torsion, need for repeat pelvic. Reviewed results from [facility]    I have discussed with patient and/or family/sig other the results, interpretation of any imaging if performed, suspected diagnosis and treatment plan to include instructions regarding the diagnoses listed to which understanding was expressed with all questions answered      Reevaluation of patient:   stable    Disposition:  Diagnosis:   1. Abdominal pain, acute, epigastric    2. PID (acute pelvic inflammatory disease)    3. Non-intractable vomiting with nausea, unspecified vomiting type        Disposition: home    Follow-up Information     Follow up With Details Comments McCurtain, DO Schedule an appointment as soon as possible for a visit If symptoms worsen [address]  [address]  [place] [address]  [phone]              Patient's Medications   Start Taking    ACETAMINOPHEN (TYLENOL) 325 MG TABLET    Take 2 Tabs by mouth every four (4) hours as needed for Pain.    AZITHROMYCIN (ZITHROMAX) 250 MG TABLET    Take 4 Tabs by mouth once for 1 dose.    IBUPROFEN (MOTRIN) 400 MG TABLET    Take 1 Tab by mouth every six (6)  hours as needed for Pain.    ONDANSETRON (ZOFRAN ODT) 4 MG  DISINTEGRATING TABLET    Take 1 Tab by mouth every eight (8) hours as needed for Nausea.   Continue Taking    ALBUTEROL (PROVENTIL HFA, VENTOLIN HFA, PROAIR HFA) 90 MCG/ACTUATION INHALER    Take 2 Puffs by inhalation every four (4) hours as needed for Wheezing.   These Medications have changed    No medications on file   Stop Taking    PREDNISONE (STERAPRED DS) 10 MG DOSE PACK    Take as directed until completion

## 2016-09-20 NOTE — ED Triage Notes (Cosign Needed)
Pt here c/o abdominal pain, vomiting and "fever" x2 days. Recently seen at [facility] for PID and chlamydia. States she is taking abx as directed     I performed a brief evaluation, including history and physical, of the patient here in triage and I have determined that pt will need further treatment and evaluation from the main side ER physician.  I have placed initial orders to help in expediting patients care.     September 20, 2016 at 7:38 PM - Earl Gala, PA-C        Visit Vitals   ??? BP 113/82 (BP 1 Location: Right arm, BP Patient Position: At rest)   ??? Pulse 73   ??? Temp 98 ??F (36.7 ??C)   ??? Resp 16   ??? Wt 53.1 kg (117 lb)   ??? SpO2 100%   ??? BMI 20.73 kg/m2

## 2016-09-20 NOTE — ED Notes (Signed)
I have reviewed discharge instructions with the patient.  The patient verbalized understanding. Discharge medications reviewed with patient and appropriate educational materials and side effects teaching were provided. Patient armband removed and shredded.

## 2016-09-21 ENCOUNTER — Inpatient Hospital Stay: Admit: 2016-09-21 | Payer: MEDICAID | Primary: Family Medicine

## 2016-09-21 DIAGNOSIS — Z90710 Acquired absence of both cervix and uterus: Secondary | ICD-10-CM

## 2016-09-21 LAB — CBC WITH AUTOMATED DIFF
ABS. BASOPHILS: 0.1 10*3/uL — ABNORMAL HIGH (ref 0.0–0.06)
ABS. EOSINOPHILS: 0 10*3/uL (ref 0.0–0.4)
ABS. LYMPHOCYTES: 3.1 10*3/uL (ref 0.9–3.6)
ABS. MONOCYTES: 0.5 10*3/uL (ref 0.05–1.2)
ABS. NEUTROPHILS: 2.8 10*3/uL (ref 1.8–8.0)
BASOPHILS: 1 % (ref 0–2)
EOSINOPHILS: 1 % (ref 0–5)
HCT: 41.9 % (ref 35.0–45.0)
HGB: 14.3 g/dL (ref 12.0–16.0)
LYMPHOCYTES: 47 % (ref 21–52)
MCH: 31.2 PG (ref 24.0–34.0)
MCHC: 34.1 g/dL (ref 31.0–37.0)
MCV: 91.5 FL (ref 74.0–97.0)
MONOCYTES: 8 % (ref 3–10)
MPV: 10.2 FL (ref 9.2–11.8)
NEUTROPHILS: 43 % (ref 40–73)
PLATELET: 229 10*3/uL (ref 135–420)
RBC: 4.58 M/uL (ref 4.20–5.30)
RDW: 12.7 % (ref 11.6–14.5)
WBC: 6.6 10*3/uL (ref 4.6–13.2)

## 2016-09-21 LAB — METABOLIC PANEL, COMPREHENSIVE
A-G Ratio: 1.8 — ABNORMAL HIGH (ref 0.8–1.7)
ALT (SGPT): 15 U/L (ref 13–56)
AST (SGOT): 14 U/L — ABNORMAL LOW (ref 15–37)
Albumin: 4.3 g/dL (ref 3.4–5.0)
Alk. phosphatase: 62 U/L (ref 45–117)
Anion gap: 6 mmol/L (ref 3.0–18)
BUN/Creatinine ratio: 14 (ref 12–20)
BUN: 13 MG/DL (ref 7.0–18)
Bilirubin, total: 0.5 MG/DL (ref 0.2–1.0)
CO2: 28 mmol/L (ref 21–32)
Calcium: 8.8 MG/DL (ref 8.5–10.1)
Chloride: 108 mmol/L (ref 100–108)
Creatinine: 0.93 MG/DL (ref 0.6–1.3)
GFR est AA: >60 mL/min/{1.73_m2} (ref >60)
GFR est non-AA: >60 mL/min/{1.73_m2} (ref >60)
Globulin: 2.4 g/dL (ref 2.0–4.0)
Glucose: 90 mg/dL (ref 74–99)
Potassium: 4 mmol/L (ref 3.5–5.5)
Protein, total: 6.7 g/dL (ref 6.4–8.2)
Sodium: 142 mmol/L (ref 136–145)

## 2016-09-21 LAB — URINE MICROSCOPIC ONLY
Bacteria: NEGATIVE /hpf
RBC: 0 /hpf (ref 0–5)
WBC: 0 /hpf (ref 0–4)

## 2016-09-21 LAB — HCG QL SERUM: HCG, Ql.: NEGATIVE

## 2016-09-21 LAB — URINALYSIS W/ RFLX MICROSCOPIC
Bilirubin: NEGATIVE
Blood: NEGATIVE
Glucose: NEGATIVE mg/dL
Nitrites: NEGATIVE
Protein: NEGATIVE mg/dL
Specific gravity: 1.024 (ref 1.005–1.030)
Urobilinogen: 0.2 EU/dL (ref 0.2–1.0)
pH (UA): 6 (ref 5.0–8.0)

## 2016-09-21 LAB — LIPASE: Lipase: 85 U/L (ref 73–393)

## 2016-09-21 MED ORDER — AZITHROMYCIN 250 MG TAB
250 mg | ORAL | Status: AC
Start: 2016-09-21 — End: 2016-09-20
  Administered 2016-09-21: 01:00:00 via ORAL

## 2016-09-21 MED ORDER — FAMOTIDINE (PF) 20 MG/2 ML IV
20 mg/2 mL | INTRAVENOUS | Status: AC
Start: 2016-09-21 — End: 2016-09-20
  Administered 2016-09-21: 01:00:00 via INTRAVENOUS

## 2016-09-21 MED ORDER — AZITHROMYCIN 250 MG TAB
250 mg | ORAL_TABLET | Freq: Once | ORAL | 0 refills | Status: AC
Start: 2016-09-21 — End: 2016-09-27

## 2016-09-21 MED ORDER — ONDANSETRON 4 MG TAB, RAPID DISSOLVE
4 mg | ORAL_TABLET | Freq: Three times a day (TID) | ORAL | 0 refills | Status: DC | PRN
Start: 2016-09-21 — End: 2016-10-03

## 2016-09-21 MED ORDER — CEFTRIAXONE 1 GRAM SOLUTION FOR INJECTION
1 gram | INTRAMUSCULAR | Status: AC
Start: 2016-09-21 — End: 2016-09-20
  Administered 2016-09-21: 01:00:00 via INTRAVENOUS

## 2016-09-21 MED ORDER — KETOROLAC TROMETHAMINE 15 MG/ML INJECTION
15 mg/mL | INTRAMUSCULAR | Status: AC
Start: 2016-09-21 — End: 2016-09-20
  Administered 2016-09-21: 01:00:00 via INTRAVENOUS

## 2016-09-21 MED ORDER — SODIUM CHLORIDE 0.9% BOLUS IV
0.9 % | Freq: Once | INTRAVENOUS | Status: AC
Start: 2016-09-21 — End: 2016-09-20
  Administered 2016-09-21: via INTRAVENOUS

## 2016-09-21 MED ORDER — ONDANSETRON (PF) 4 MG/2 ML INJECTION
4 mg/2 mL | INTRAMUSCULAR | Status: AC
Start: 2016-09-21 — End: 2016-09-20
  Administered 2016-09-21: 01:00:00 via INTRAVENOUS

## 2016-09-21 MED ORDER — IBUPROFEN 400 MG TAB
400 mg | ORAL_TABLET | Freq: Four times a day (QID) | ORAL | 0 refills | Status: DC | PRN
Start: 2016-09-21 — End: 2016-10-03

## 2016-09-21 MED ORDER — ACETAMINOPHEN 325 MG TABLET
325 mg | ORAL_TABLET | ORAL | 0 refills | Status: DC | PRN
Start: 2016-09-21 — End: 2016-10-03

## 2016-09-21 MED FILL — KETOROLAC TROMETHAMINE 15 MG/ML INJECTION: 15 mg/mL | INTRAMUSCULAR | Qty: 1

## 2016-09-21 MED FILL — CEFTRIAXONE 1 GRAM SOLUTION FOR INJECTION: 1 gram | INTRAMUSCULAR | Qty: 1

## 2016-09-21 MED FILL — ONDANSETRON (PF) 4 MG/2 ML INJECTION: 4 mg/2 mL | INTRAMUSCULAR | Qty: 2

## 2016-09-21 MED FILL — AZITHROMYCIN 250 MG TAB: 250 mg | ORAL | Qty: 4

## 2016-09-21 MED FILL — SODIUM CHLORIDE 0.9 % IV: INTRAVENOUS | Qty: 1000

## 2016-09-21 MED FILL — FAMOTIDINE (PF) 20 MG/2 ML IV: 20 mg/2 mL | INTRAVENOUS | Qty: 2

## 2016-10-03 ENCOUNTER — Emergency Department: Admit: 2016-10-03 | Payer: MEDICAID | Primary: Family Medicine

## 2016-10-03 ENCOUNTER — Inpatient Hospital Stay
Admit: 2016-10-03 | Discharge: 2016-10-03 | Disposition: A | Discharge status: Home / Self Care | Payer: MEDICAID | Attending: Emergency Medicine

## 2016-10-03 DIAGNOSIS — S9032XA Contusion of left foot, initial encounter: Secondary | ICD-10-CM

## 2016-10-03 MED ORDER — NAPROXEN 375 MG TAB
375 mg | ORAL_TABLET | Freq: Two times a day (BID) | ORAL | 0 refills | Status: AC
Start: 2016-10-03 — End: ?

## 2016-10-03 NOTE — ED Provider Notes (Signed)
Sondra Barges  [facility] EMERGENCY DEPT      6:40 PM    Date: 10/03/2016  Patient Name: Jennifer Nixon    History of Presenting Illness     Chief Complaint   Patient presents with   ??? Foot Pain     History Provided By: Patient    Chief Complaint: left foot pain   Duration:  Weeks  Timing:  Constant  Location: left foot   Quality: throbbing   Severity: Moderate  Modifying Factors: worse with ambulation   Associated Symptoms: denies any other associated signs or symptoms    31 y.o. female with a PMH of Asthma presents to the ED c/o left foot pain for the past 2 weeks.  Pt notes getting a box out of her garage that fell and landed on her left foot from about 4 feet high.  States she has had constant and worsening pain since that time.  Took Ibuprofen without relief of symptoms.  Denies numbness, weakness, other injury, redness, warmth, or other symptoms at this time.     No other complaints.     Nursing notes regarding the HPI and triage nursing notes were reviewed.     Prior medical records were reviewed.     Current Outpatient Prescriptions   Medication Sig Dispense Refill   ??? naproxen (NAPROSYN) 375 mg tablet Take 1 Tab by mouth two (2) times daily (with meals). 20 Tab 0   ??? albuterol (PROVENTIL HFA, VENTOLIN HFA, PROAIR HFA) 90 mcg/actuation inhaler Take 2 Puffs by inhalation every four (4) hours as needed for Wheezing. 1 Inhaler 0       Past History     Past Medical History:  Past Medical History:   Diagnosis Date   ??? Asthma        Past Surgical History:  Past Surgical History:   Procedure Laterality Date   ??? HX APPENDECTOMY     ??? HX CHOLECYSTECTOMY     ??? HX HEENT     ??? HX HYSTERECTOMY         Family History:  History reviewed. No pertinent family history.    Social History:  Social History   Substance Use Topics   ??? Smoking status: Never Smoker   ??? Smokeless tobacco: Never Used   ??? Alcohol use No       Allergies:  No Known Allergies    Patient's primary care provider (as noted in EPIC):  None    Review of Systems      Constitutional:  Denies malaise, fever, chills.   Extremity/MS:  + left foot pain and swelling.   Neuro:  Denies neurologic symptoms/deficits/paresthesias.   Skin: Denies injury, rash, itching or skin changes.  All other systems negative as reviewed.     Visit Vitals   ??? BP 119/78 (BP 1 Location: Right arm, BP Patient Position: At rest;Sitting)   ??? Pulse 76   ??? Temp 98.2 ??F (36.8 ??C)   ??? Resp 16   ??? Ht 5\' 3"  (1.6 m)   ??? Wt 54.7 kg (120 lb 8 oz)   ??? SpO2 100%   ??? BMI 21.35 kg/m2       PHYSICAL EXAM:    CONSTITUTIONAL:  Alert, in no apparent distress;  well developed;  well nourished.  HEAD:  Normocephalic, atraumatic.  EYES:  EOMI.  Non-icteric sclera.  Normal conjunctiva.  ENTM:  Mouth: mucous membranes moist.  NECK:  Supple  RESPIRATORY:  Chest clear, equal breath sounds, good air  movement.  Without wheezes, rhonchi or rales.   CARDIOVASCULAR:  Regular rate and rhythm.  No murmurs, rubs, or gallops.  UPPER EXT:  Normal inspection.  LOWER EXT:  TTP to left lateral foot; moderate edema noted with minimal ecchymosis; normal gait; NVI distally with 5/5 strength.   NEURO:  Moves all four extremities, and grossly normal motor exam.  SKIN:  No rashes;  Normal for age.  PSYCH:  Alert and normal affect.    DIFFERENTIAL DIAGNOSES/ MEDICAL DECISION MAKING:  Contusion, sprain, dislocation, fracture, ligamentous tear/ disruption or a combination of the above.    Xray left foot: NAD     IMPRESSION AND MEDICAL DECISION MAKING:  Based upon the patient???s presentation with noted HPI and PE, along with the work up done in the emergency department, I believe that the patient is having noted foot contusion.  Will write for Naprosyn and have f/u with PCP.  Pt to rest, ice, compression, elevation, and take Naprosyn.     Diagnosis:   1. Contusion of left foot, initial encounter      Disposition: Discharge     Follow-up Information     Follow up With Details Comments Contact Info    [facility] In 3 days  [address]   [place] [id number]  [phone]    [facility] EMERGENCY DEPT  If symptoms worsen [address]  [place] [id number]  [phone]          Patient's Medications   Start Taking    NAPROXEN (NAPROSYN) 375 MG TABLET    Take 1 Tab by mouth two (2) times daily (with meals).   Continue Taking    ALBUTEROL (PROVENTIL HFA, VENTOLIN HFA, PROAIR HFA) 90 MCG/ACTUATION INHALER    Take 2 Puffs by inhalation every four (4) hours as needed for Wheezing.   These Medications have changed    No medications on file   Stop Taking    ACETAMINOPHEN (TYLENOL) 325 MG TABLET    Take 2 Tabs by mouth every four (4) hours as needed for Pain.    IBUPROFEN (MOTRIN) 400 MG TABLET    Take 1 Tab by mouth every six (6) hours as needed for Pain.    ONDANSETRON (ZOFRAN ODT) 4 MG DISINTEGRATING TABLET    Take 1 Tab by mouth every eight (8) hours as needed for Nausea.     Nicolette BangAshlee L Marquez Ceesay, PA

## 2016-10-03 NOTE — ED Triage Notes (Signed)
Patient states 2 weeks ago a heavy cardboard box fell on her left foot, progressively more painful and states "a bone si sticking out of my foot"

## 2016-10-03 NOTE — ED Notes (Signed)
Discharge medications reviewed with patient and appropriate educational materials and side effects teaching were provided. I have reviewed discharge instructions with the patient.  The patient verbalized understanding.

## 2016-10-29 ENCOUNTER — Inpatient Hospital Stay
Admit: 2016-10-29 | Discharge: 2016-10-29 | Disposition: A | Discharge status: Home / Self Care | Payer: MEDICAID | Attending: Emergency Medicine

## 2016-10-29 DIAGNOSIS — R42 Dizziness and giddiness: Secondary | ICD-10-CM

## 2016-10-29 LAB — D-DIMER, QUANTITATIVE: D-Dimer, Quant: 0.3 ug/ml(FEU) (ref <0.46)

## 2016-10-29 LAB — METABOLIC PANEL, BASIC
Anion gap: 7 mmol/L (ref 3.0–18)
BUN/Creatinine ratio: 15 (ref 12–20)
BUN: 14 MG/DL (ref 7.0–18)
CO2: 25 mmol/L (ref 21–32)
Calcium: 8.7 MG/DL (ref 8.5–10.1)
Chloride: 112 mmol/L — ABNORMAL HIGH (ref 100–108)
Creatinine: 0.94 MG/DL (ref 0.6–1.3)
GFR est AA: >60 mL/min/{1.73_m2} (ref >60)
GFR est non-AA: >60 mL/min/{1.73_m2} (ref >60)
Glucose: 76 mg/dL (ref 74–99)
Potassium: 4.2 mmol/L (ref 3.5–5.5)
Sodium: 144 mmol/L (ref 136–145)

## 2016-10-29 LAB — CBC WITH AUTOMATED DIFF
ABS. BASOPHILS: 0 10*3/uL (ref 0.0–0.06)
ABS. EOSINOPHILS: 0 10*3/uL (ref 0.0–0.4)
ABS. LYMPHOCYTES: 3 10*3/uL (ref 0.9–3.6)
ABS. MONOCYTES: 0.4 10*3/uL (ref 0.05–1.2)
ABS. NEUTROPHILS: 1.8 10*3/uL (ref 1.8–8.0)
BASOPHILS: 1 % (ref 0–2)
EOSINOPHILS: 1 % (ref 0–5)
HCT: 40.7 % (ref 35.0–45.0)
HGB: 13.8 g/dL (ref 12.0–16.0)
LYMPHOCYTES: 56 % — ABNORMAL HIGH (ref 21–52)
MCH: 30.9 PG (ref 24.0–34.0)
MCHC: 33.9 g/dL (ref 31.0–37.0)
MCV: 91.1 FL (ref 74.0–97.0)
MONOCYTES: 8 % (ref 3–10)
MPV: 9.4 FL (ref 9.2–11.8)
NEUTROPHILS: 34 % — ABNORMAL LOW (ref 40–73)
PLATELET: 205 10*3/uL (ref 135–420)
RBC: 4.47 M/uL (ref 4.20–5.30)
RDW: 12.9 % (ref 11.6–14.5)
WBC: 5.3 10*3/uL (ref 4.6–13.2)

## 2016-10-29 LAB — URINE MICROSCOPIC ONLY
RBC: 0 /hpf (ref 0–5)
WBC: 0 /hpf (ref 0–4)

## 2016-10-29 LAB — URINALYSIS W/ RFLX MICROSCOPIC
Bilirubin: NEGATIVE
Blood: NEGATIVE
Glucose: NEGATIVE mg/dL
Ketone: NEGATIVE mg/dL
Nitrites: NEGATIVE
Protein: NEGATIVE mg/dL
Specific gravity: 1.02 (ref 1.005–1.030)
Urobilinogen: 0.2 EU/dL (ref 0.2–1.0)
pH (UA): 7 (ref 5.0–8.0)

## 2016-10-29 LAB — D DIMER: D DIMER: 0.3 ug/ml(FEU) (ref <0.46)

## 2016-10-29 MED ORDER — SODIUM CHLORIDE 0.9% BOLUS IV
0.9 % | Freq: Once | INTRAVENOUS | Status: AC
Start: 2016-10-29 — End: 2016-10-29
  Administered 2016-10-29: 19:00:00 via INTRAVENOUS

## 2016-10-29 MED FILL — SODIUM CHLORIDE 0.9 % IV: INTRAVENOUS | Qty: 1000

## 2016-10-29 NOTE — ED Notes (Signed)
Patient ambulated to the bathroom NAD and no complaints of dizziness.

## 2016-10-29 NOTE — ED Triage Notes (Signed)
Presents with dizziness, wheezing and lower abdominal pain onset today.

## 2016-10-29 NOTE — ED Provider Notes (Signed)
Sondra BargesBon Shasta  [facility]      2:45 PM    Date: 10/29/2016  Patient Name: Jennifer Nixon    History of Presenting Illness     Chief Complaint   Patient presents with   ??? Dizziness   ??? Shortness of Breath     History Provided By: Patient    Chief Complaint: lightheadedness, fatigue, near syncope   Duration:  Hours  Timing:  Acute  Location: n/a   Quality: n/a   Severity: Mild  Modifying Factors: none   Associated Symptoms: dull suprapubic discomfort x 4+ weeks    31 y.o. female with a PMH of Asthma presents to the emergency department c/o lightheadedness since PTA.  Pt states she was at [employer] standing up when she developed sudden onset lightheadedness, feeling hot, and near syncope.  Says she sat down and never actually passed out.  She reports eating and drinking normally this morning.  Says she was wheezing early this morning, but that has resolved and not returned.  Pt had a hysterectomy but still gets hormone shots.  She says she currently still feels a bit lightheaded and fatigued.  Pt also reports having dull suprapubic discomfort x several weeks.  Pt notes being seen for the abdominal pain and was seen by an OBGYN last week for this.  No change in her abdominal pain.  She denies any fever, chills, headache, numbness, weakness, slurred speech, chest pain, current SOB, or other symptoms at this time.     No other complaints.     Nursing notes regarding the HPI and triage nursing notes were reviewed.     Prior medical records were reviewed.     Current Outpatient Prescriptions   Medication Sig Dispense Refill   ??? albuterol (PROVENTIL HFA, VENTOLIN HFA, PROAIR HFA) 90 mcg/actuation inhaler Take 2 Puffs by inhalation every four (4) hours as needed for Wheezing. 1 Inhaler 0   ??? naproxen (NAPROSYN) 375 mg tablet Take 1 Tab by mouth two (2) times daily (with meals). 20 Tab 0       Past History     Past Medical History:  Past Medical History:   Diagnosis Date   ??? Asthma        Past Surgical History:  Past  Surgical History:   Procedure Laterality Date   ??? HX APPENDECTOMY     ??? HX CHOLECYSTECTOMY     ??? HX HEENT     ??? HX HYSTERECTOMY         Family History:  History reviewed. No pertinent family history.    Social History:  Social History   Substance Use Topics   ??? Smoking status: Never Smoker   ??? Smokeless tobacco: Never Used   ??? Alcohol use No       Allergies:  Allergies   Allergen Reactions   ??? Latex Hives   ??? Aspirin Anaphylaxis   ??? Morphine Rash       Patient's primary care provider (as noted in EPIC):  None    Review of Systems   Constitutional:  Denies malaise, fever, chills.   Cardiac:  Denies chest pain or palpitations.   Respiratory: + resolved wheezing.  Denies cough, difficulty breathing, shortness of breath.   GI/ABD: + chronic abdominal pain x 4+ weeks.  Denies injury, pain, distention, nausea, vomiting, diarrhea.   GU:  Denies injury, pain, dysuria or urgency.   Back:  Denies injury or pain.   Pelvis:  Denies injury or pain.  Extremity/MS:  Denies injury or pain.   Neuro:  + lightheadedness. Denies headache, LOC, neurologic symptoms/deficits/paresthesias.   Skin: Denies injury, rash, itching or skin changes.  All other systems negative as reviewed.     Visit Vitals   ??? BP 124/82   ??? Pulse 77   ??? Temp 98.5 ??F (36.9 ??C)   ??? Resp 18   ??? Ht 5\' 3"  (1.6 m)   ??? Wt 53.5 kg (118 lb)   ??? SpO2 100%   ??? BMI 20.9 kg/m2       PHYSICAL EXAM:    CONSTITUTIONAL:  Alert, in no apparent distress;  well developed;  well nourished.  HEAD:  Normocephalic, atraumatic.  EYES:  EOMI.  Non-icteric sclera.  Normal conjunctiva.  ENTM:  Mouth: mucous membranes moist.  NECK:  Supple  RESPIRATORY:  Chest clear, equal breath sounds, good air movement.  Without wheezes, rhonchi or rales.   CARDIOVASCULAR:  Regular rate and rhythm.  No murmurs, rubs, or gallops.  GI:  Normal bowel sounds, abdomen soft and non-tender.  No rebound or guarding.  BACK:  Non-tender.  UPPER EXT:  Normal inspection.  LOWER EXT:  No edema, no calf tenderness.   Distal pulses intact.  NEURO:  Moves all four extremities.  Normal motor exam and sensation in all four extremities.  Normal CN II-XII exam.  Normal bilateral finger-to-nose exam.    SKIN:  No rashes;  Normal for age.  PSYCH:  Alert and normal affect.    DIFFERENTIAL DIAGNOSES/ MEDICAL DECISION MAKING:   Dehydration, hyperglycemia-induced weakness, electrolyte and/or endocrine imbalance, cardiac arrhythmia, central versus peripheral vertigo, illicit drug intoxication, alcohol intoxication, prescribed drug toxicity, anxiety disorder, versus other etiologies or a combination of the above.    ED COURSE:      EKG: NSR rate of 67bpm; no STEMI.     Recent Results (from the past 12 hour(s))   EKG, 12 LEAD, INITIAL    Collection Time: 10/29/16  1:53 PM   Result Value Ref Range    Ventricular Rate 67 BPM    Atrial Rate 67 BPM    P-R Interval 128 ms    QRS Duration 78 ms    Q-T Interval 370 ms    QTC Calculation (Bezet) 390 ms    Calculated R Axis -18 degrees    Calculated T Axis 1 degrees    Diagnosis       Normal sinus rhythm  Normal ECG  When compared with ECG of 04-Jul-2016 13:56,  T wave inversion now evident in Inferior leads     CBC WITH AUTOMATED DIFF    Collection Time: 10/29/16  2:04 PM   Result Value Ref Range    WBC 5.3 4.6 - 13.2 K/uL    RBC 4.47 4.20 - 5.30 M/uL    HGB 13.8 12.0 - 16.0 g/dL    HCT 16.1 09.6 - 04.5 %    MCV 91.1 74.0 - 97.0 FL    MCH 30.9 24.0 - 34.0 PG    MCHC 33.9 31.0 - 37.0 g/dL    RDW 40.9 81.1 - 91.4 %    PLATELET 205 135 - 420 K/uL    MPV 9.4 9.2 - 11.8 FL    NEUTROPHILS 34 (L) 40 - 73 %    LYMPHOCYTES 56 (H) 21 - 52 %    MONOCYTES 8 3 - 10 %    EOSINOPHILS 1 0 - 5 %    BASOPHILS 1 0 - 2 %  ABS. NEUTROPHILS 1.8 1.8 - 8.0 K/UL    ABS. LYMPHOCYTES 3.0 0.9 - 3.6 K/UL    ABS. MONOCYTES 0.4 0.05 - 1.2 K/UL    ABS. EOSINOPHILS 0.0 0.0 - 0.4 K/UL    ABS. BASOPHILS 0.0 0.0 - 0.06 K/UL    DF AUTOMATED     METABOLIC PANEL, BASIC    Collection Time: 10/29/16  2:04 PM   Result Value Ref Range     Sodium 144 136 - 145 mmol/L    Potassium 4.2 3.5 - 5.5 mmol/L    Chloride 112 (H) 100 - 108 mmol/L    CO2 25 21 - 32 mmol/L    Anion gap 7 3.0 - 18 mmol/L    Glucose 76 74 - 99 mg/dL    BUN 14 7.0 - 18 MG/DL    Creatinine 1.61 0.6 - 1.3 MG/DL    BUN/Creatinine ratio 15 12 - 20      GFR est AA >60 >60 ml/min/1.44m2    GFR est non-AA >60 >60 ml/min/1.42m2    Calcium 8.7 8.5 - 10.1 MG/DL   D DIMER    Collection Time: 10/29/16  2:04 PM   Result Value Ref Range    D DIMER 0.30 <0.46 ug/ml(FEU)   URINALYSIS W/ RFLX MICROSCOPIC    Collection Time: 10/29/16  2:36 PM   Result Value Ref Range    Color YELLOW      Appearance CLEAR      Specific gravity 1.020 1.005 - 1.030      pH (UA) 7.0 5.0 - 8.0      Protein NEGATIVE  NEG mg/dL    Glucose NEGATIVE  NEG mg/dL    Ketone NEGATIVE  NEG mg/dL    Bilirubin NEGATIVE  NEG      Blood NEGATIVE  NEG      Urobilinogen 0.2 0.2 - 1.0 EU/dL    Nitrites NEGATIVE  NEG      Leukocyte Esterase SMALL (A) NEG          IMPRESSION AND MEDICAL DECISION MAKING:  Based upon the patient's presentation with noted HPI and PE, along with the work up done in the emergency department, I believe that the patient is having dizziness of uncertain etiology.  I am comfortable with discharge of the patient home.  She notes improvement since the episode earlier.  Labs and EKG look well.      Diagnosis:   1. Lightheadedness    2. Chronic abdominal pain      Disposition: Discharge    Follow-up Information     Follow up With Details Comments Contact Info    Jonah R Flowers, DO In 3 days  [address]  [address]  [facility] [place] [address]  [phone]      [facility] EMERGENCY DEPT  If symptoms worsen [address]  [place] [address]  [phone]          Patient's Medications   Start Taking    No medications on file   Continue Taking    ALBUTEROL (PROVENTIL HFA, VENTOLIN HFA, PROAIR HFA) 90 MCG/ACTUATION INHALER    Take 2 Puffs by inhalation every four (4) hours as needed for Wheezing.     NAPROXEN (NAPROSYN) 375 MG TABLET    Take 1 Tab by mouth two (2) times daily (with meals).   These Medications have changed    No medications on file   Stop Taking    No medications on file  Skeet Simmer, PA

## 2016-10-29 NOTE — ED Notes (Signed)
I have reviewed discharge instructions with the patient.  The patient verbalized understanding. Patient NAD, VSS.  Patient armband removed and shredded.

## 2016-10-29 NOTE — ED Notes (Cosign Needed)
Patient reports she was at work today and got over heated and almost passed out.  Patient reports mild lower abdominal pain, and feeling fatigued fatigue.  HX of hysterectomy.  Reports mild intermittent wheezing this morning.

## 2016-10-30 ENCOUNTER — Encounter: Payer: MEDICAID | Primary: Family Medicine

## 2016-10-30 LAB — EKG 12-LEAD
Atrial Rate: 67 {beats}/min
Diagnosis: NORMAL
P-R Interval: 128 ms
Q-T Interval: 370 ms
QRS Duration: 78 ms
QTc Calculation (Bazett): 390 ms
R Axis: -18 degrees
T Axis: 1 degrees
Ventricular Rate: 67 {beats}/min

## 2016-10-30 LAB — EKG, 12 LEAD, INITIAL
Atrial Rate: 67 {beats}/min
Calculated R Axis: -18 degrees
Calculated T Axis: 1 degrees
Diagnosis: NORMAL
P-R Interval: 128 ms
Q-T Interval: 370 ms
QRS Duration: 78 ms
QTC Calculation (Bezet): 390 ms
Ventricular Rate: 67 {beats}/min

## 2016-10-31 LAB — CULTURE, URINE
Culture result:: 30000
Culture: 30000

## 2016-11-12 ENCOUNTER — Inpatient Hospital Stay
Admit: 2016-11-12 | Discharge: 2016-11-13 | Disposition: A | Discharge status: Home / Self Care | Payer: MEDICAID | Attending: Emergency Medicine

## 2016-11-12 DIAGNOSIS — B009 Herpesviral infection, unspecified: Secondary | ICD-10-CM

## 2016-11-12 NOTE — ED Provider Notes (Signed)
EMERGENCY DEPARTMENT HISTORY AND PHYSICAL EXAM    Date: 11/12/2016  Patient Name: Jennifer Nixon    History of Presenting Illness     Chief Complaint   Patient presents with   ??? Rash         History Provided By: Patient    Chief Complaint: rash  Duration: 1 Weeks  Timing:  Acute  Location: right of rectum  Quality: Burning  Severity: Moderate  Modifying Factors: none  Associated Symptoms: denies any other associated signs or symptoms      Additional History (Context): Jennifer Nixon is a 31 y.o. female with No significant past medical history who presents with a painful rash next to her rectum.  Denies trauma, fever, discharge, change in stooling.    PCP: None    Current Facility-Administered Medications   Medication Dose Route Frequency Provider Last Rate Last Dose   ??? lidocaine (XYLOCAINE) 2 % jelly   Mucous Membrane PRN Karen Kitchens, PA         Current Outpatient Prescriptions   Medication Sig Dispense Refill   ??? acyclovir (ZOVIRAX) 800 mg tablet Take 0.5 Tabs by mouth three (3) times daily for 10 days. 30 Tab 0   ??? naproxen (NAPROSYN) 375 mg tablet Take 1 Tab by mouth two (2) times daily (with meals). 20 Tab 0   ??? albuterol (PROVENTIL HFA, VENTOLIN HFA, PROAIR HFA) 90 mcg/actuation inhaler Take 2 Puffs by inhalation every four (4) hours as needed for Wheezing. 1 Inhaler 0       Past History     Past Medical History:  Past Medical History:   Diagnosis Date   ??? Asthma        Past Surgical History:  Past Surgical History:   Procedure Laterality Date   ??? HX APPENDECTOMY     ??? HX CHOLECYSTECTOMY     ??? HX HEENT     ??? HX HYSTERECTOMY         Family History:  History reviewed. No pertinent family history.    Social History:  Social History   Substance Use Topics   ??? Smoking status: Never Smoker   ??? Smokeless tobacco: Never Used   ??? Alcohol use No       Allergies:  Allergies   Allergen Reactions   ??? Latex Hives   ??? Aspirin Anaphylaxis   ??? Morphine Rash         Review of Systems   Review of Systems   Skin: Positive for  rash.   All other systems reviewed and are negative.    All Other Systems Negative  Physical Exam     Vitals:    11/12/16 1916   BP: 112/73   Pulse: 79   Resp: 16   Temp: 99 ??F (37.2 ??C)   SpO2: 100%   Height: 5\' 3"  (1.6 m)     Physical Exam   Constitutional: She is oriented to person, place, and time. She appears well-developed.   HENT:   Head: Normocephalic and atraumatic.   Eyes: Pupils are equal, round, and reactive to light.   Neck: No JVD present. No tracheal deviation present. No thyromegaly present.   Cardiovascular: Normal rate, regular rhythm and normal heart sounds.  Exam reveals no gallop and no friction rub.    No murmur heard.  Pulmonary/Chest: Effort normal and breath sounds normal. No stridor. No respiratory distress. She has no wheezes. She has no rales. She exhibits no tenderness.   Abdominal: Soft. She exhibits  no distension and no mass. There is no tenderness. There is no rebound and no guarding.   Musculoskeletal: She exhibits no edema or tenderness.   Lymphadenopathy:     She has no cervical adenopathy.   Neurological: She is alert and oriented to person, place, and time.   Skin: Skin is warm and dry. Rash noted. No erythema. No pallor.   To the right of her rectum there is a tender vesiculated linear erythematous grouping of lesions.   Psychiatric: She has a normal mood and affect. Her behavior is normal. Thought content normal.   Nursing note and vitals reviewed.             Diagnostic Study Results     Labs -   No results found for this or any previous visit (from the past 12 hour(s)).    Radiologic Studies -   No orders to display     CT Results  (Last 48 hours)    None        CXR Results  (Last 48 hours)    None            Medical Decision Making   I am the first provider for this patient.    I reviewed the vital signs, available nursing notes, past medical history, past surgical history, family history and social history.    Vital Signs-Reviewed the patient's vital signs.      Records  Reviewed: Nursing Notes    Procedures:  Procedures    Provider Notes (Medical Decision Making): clinically appears c/w herpes.   Sent swab; treat pain and lesions.    MED RECONCILIATION:  Current Facility-Administered Medications   Medication Dose Route Frequency   ??? lidocaine (XYLOCAINE) 2 % jelly   Mucous Membrane PRN     Current Outpatient Prescriptions   Medication Sig   ??? acyclovir (ZOVIRAX) 800 mg tablet Take 0.5 Tabs by mouth three (3) times daily for 10 days.   ??? naproxen (NAPROSYN) 375 mg tablet Take 1 Tab by mouth two (2) times daily (with meals).   ??? albuterol (PROVENTIL HFA, VENTOLIN HFA, PROAIR HFA) 90 mcg/actuation inhaler Take 2 Puffs by inhalation every four (4) hours as needed for Wheezing.       Disposition:  home    DISCHARGE NOTE:   9:14 PM    Pt has been reexamined.  Patient has no new complaints, changes, or physical findings.  Care plan outlined and precautions discussed.  Results of exam were reviewed with the patient. All medications were reviewed with the patient; will d/c home with acyclovir. All of pt's questions and concerns were addressed. Patient was instructed and agrees to follow up with PPM, as well as to return to the ED upon further deterioration. Patient is ready to go home.    Follow-up Information     Follow up With Details Comments Contact Info    [facility] Schedule an appointment as soon as possible for a visit in 3 days  [address]  [place] [address]  [phone]    [facility] EMERGENCY DEPT  call back in 3d for test results; return immediately for any worsening. [address]  [place] [address]  [phone]          Current Discharge Medication List      START taking these medications    Details   acyclovir (ZOVIRAX) 800 mg tablet Take 0.5 Tabs by mouth three (3) times daily for 10 days.  Qty:  30 Tab, Refills: 0               Diagnosis     Clinical Impression:   1. Herpes

## 2016-11-12 NOTE — ED Triage Notes (Signed)
Rash for one week on right buttock.

## 2016-11-12 NOTE — ED Notes (Signed)
Discharge medications reviewed with patient and appropriate educational materials and side effects teaching were provided. I have reviewed discharge instructions with the patient.  The patient verbalized understanding.

## 2016-11-13 MED ORDER — LIDOCAINE 2 % MUCOSAL GEL
2 % | Status: DC | PRN
Start: 2016-11-13 — End: 2016-11-13
  Administered 2016-11-13: 02:00:00

## 2016-11-13 MED ORDER — ACYCLOVIR 800 MG TAB
800 mg | ORAL_TABLET | Freq: Three times a day (TID) | ORAL | 0 refills | Status: AC
Start: 2016-11-13 — End: 2016-11-22

## 2016-11-13 MED FILL — LIDOCAINE 2 % MUCOSAL GEL: 2 % | Qty: 5

## 2016-11-14 ENCOUNTER — Inpatient Hospital Stay
Admit: 2016-11-14 | Discharge: 2016-11-14 | Disposition: A | Discharge status: Home / Self Care | Payer: MEDICAID | Attending: Emergency Medicine

## 2016-11-14 DIAGNOSIS — B009 Herpesviral infection, unspecified: Secondary | ICD-10-CM

## 2016-11-14 NOTE — ED Triage Notes (Signed)
"  I was told I had herpes but I don't think I have them. I have an appointment today."

## 2016-11-14 NOTE — ED Provider Notes (Signed)
EMERGENCY DEPARTMENT HISTORY AND PHYSICAL EXAM    Date: 11/14/2016  Patient Name: Jennifer Nixon    History of Presenting Illness     Chief Complaint   Patient presents with   ??? Other         History Provided By: Patient    Chief Complaint: rash  Duration:2 Days  Timing:  Constant  Location: buttocks  Quality: burning  Severity: 8 out of 10  Modifying Factors: none  Associated Symptoms: denies any other associated signs or symptoms      Additional History (Context): Jennifer Nixon is a 31 y.o. female with asthma who presents with rash to her buttocks.  Pt states that she was seen here 2 days ago and told she has herpes but she does not believe that the provider evaluated her enough to make that diagnosis.  She presents for lab results.      PCP: None    Current Outpatient Prescriptions   Medication Sig Dispense Refill   ??? acyclovir (ZOVIRAX) 800 mg tablet Take 0.5 Tabs by mouth three (3) times daily for 10 days. 30 Tab 0   ??? naproxen (NAPROSYN) 375 mg tablet Take 1 Tab by mouth two (2) times daily (with meals). 20 Tab 0   ??? albuterol (PROVENTIL HFA, VENTOLIN HFA, PROAIR HFA) 90 mcg/actuation inhaler Take 2 Puffs by inhalation every four (4) hours as needed for Wheezing. 1 Inhaler 0       Past History     Past Medical History:  Past Medical History:   Diagnosis Date   ??? Asthma        Past Surgical History:  Past Surgical History:   Procedure Laterality Date   ??? HX APPENDECTOMY     ??? HX CHOLECYSTECTOMY     ??? HX HEENT     ??? HX HYSTERECTOMY         Family History:  History reviewed. No pertinent family history.    Social History:  Social History   Substance Use Topics   ??? Smoking status: Never Smoker   ??? Smokeless tobacco: Never Used   ??? Alcohol use No       Allergies:  Allergies   Allergen Reactions   ??? Latex Hives   ??? Aspirin Anaphylaxis   ??? Morphine Rash         Review of Systems   Review of Systems   Constitutional: Negative for fatigue and fever.   HENT: Negative for congestion.    Eyes: Negative for pain.    Respiratory: Negative for cough and shortness of breath.    Cardiovascular: Negative for chest pain.   Gastrointestinal: Negative for abdominal pain, diarrhea and vomiting.   Genitourinary: Negative for dysuria, pelvic pain, vaginal bleeding, vaginal discharge and vaginal pain.   Musculoskeletal: Negative for back pain.   Skin: Positive for color change and rash.   Neurological: Negative for dizziness and headaches.   Psychiatric/Behavioral: Negative for behavioral problems.   All other systems reviewed and are negative.    All Other Systems Negative  Physical Exam     Vitals:    11/14/16 1033   BP: (!) 124/93   Pulse: 74   Resp: 16   Temp: 98.1 ??F (36.7 ??C)   SpO2: 98%     Physical Exam   Constitutional: She is oriented to person, place, and time. She appears well-developed and well-nourished. No distress.   HENT:   Head: Normocephalic and atraumatic.   Nose: Nose normal.   Eyes: Conjunctivae  are normal. Pupils are equal, round, and reactive to light.   Neck: Normal range of motion.   Cardiovascular: Normal rate.    Pulmonary/Chest: Effort normal. No respiratory distress.   Neurological: She is alert and oriented to person, place, and time.   Skin: Rash noted. Rash is pustular.        Vesicular rash on erythematous base   Psychiatric: She has a normal mood and affect. Her behavior is normal.   Vitals reviewed.             Diagnostic Study Results     Labs -   No results found for this or any previous visit (from the past 12 hour(s)).    Radiologic Studies -   No orders to display     CT Results  (Last 48 hours)    None        CXR Results  (Last 48 hours)    None            Medical Decision Making   I am the first provider for this patient.    I reviewed the vital signs, available nursing notes, past medical history, past surgical history, family history and social history.    Vital Signs-Reviewed the patient's vital signs.      Pulse Oximetry Analysis - 98% on RA      Records Reviewed: Old Medical Records     Procedures:  Procedures    Provider Notes (Medical Decision Making):   Informed pt lab results were not back yet and pt requested revaluation for second opinion.      Agree with initial diagnosis, educated pt on herpes and encouraged to continue prescribed home medications.        MED RECONCILIATION:  No current facility-administered medications for this encounter.      Current Outpatient Prescriptions   Medication Sig   ??? acyclovir (ZOVIRAX) 800 mg tablet Take 0.5 Tabs by mouth three (3) times daily for 10 days.   ??? naproxen (NAPROSYN) 375 mg tablet Take 1 Tab by mouth two (2) times daily (with meals).   ??? albuterol (PROVENTIL HFA, VENTOLIN HFA, PROAIR HFA) 90 mcg/actuation inhaler Take 2 Puffs by inhalation every four (4) hours as needed for Wheezing.       Disposition:  discharge    DISCHARGE NOTE:     Pt has been reexamined.  Patient has no new complaints, changes, or physical findings.  Care plan outlined and precautions discussed.  Results of exam were reviewed with the patient. All medications were reviewed with the patient; will d/c home with instructions to continue prescribed medications. All of pt's questions and concerns were addressed. Patient was instructed and agrees to follow up with PCP, as well as to return to the ED upon further deterioration. Patient is ready to go home.    Follow-up Information     Follow up With Details Comments Contact Info    Your OBGYN Call As needed, follow up     [facility] EMERGENCY DEPT  If symptoms worsen [address]  [place] [address]  [phone]          Current Discharge Medication List              Diagnosis     Clinical Impression:   1. Herpes

## 2016-11-14 NOTE — ED Notes (Signed)
I have reviewed discharge instructions with the patient.  The patient verbalized understanding. Patient NAD, VSS.  Patient armband removed and shredded.

## 2016-11-14 NOTE — ED Notes (Signed)
Patient speaking to PA Willis and states she only came in to receive her results from her previous visit.

## 2016-11-14 NOTE — Progress Notes (Addendum)
Chart reviewed for multiple ED visits. Pt has insurance but no PCP. Met with pt at bedside. She states that she has an appointment with Dr Casimer Leek on 11/23/16. She also have a GYN appointment today at 1400. PCP appointment with Dr Casimer Leek on 11/23/16 at 1400.

## 2016-11-15 LAB — CULTURE, HSV W/ TYPING

## 2016-12-25 ENCOUNTER — Inpatient Hospital Stay: Admit: 2016-12-25 | Discharge: 2016-12-25 | Payer: MEDICAID | Attending: Emergency Medicine

## 2016-12-25 NOTE — ED Triage Notes (Signed)
Pt arrived through triage with c/o cough, fever, flu, abdominal pain, vaginal discharge, and herpes outbreak

## 2016-12-25 NOTE — ED Notes (Signed)
Pt not found in [facility] nor outside. Registration said pt left shortly after triage.

## 2017-04-06 DIAGNOSIS — B009 Herpesviral infection, unspecified: Secondary | ICD-10-CM

## 2017-04-06 NOTE — ED Provider Notes (Signed)
[facility]  Emergency Department Treatment Report    Patient: Jennifer Nixon Age: 31 y.o. Sex: female    Date of Birth: [birth year] Admit Date: 04/06/2017 PCP: Valaria Good, DO   MRN: [id number]  CSN: [id number]  Attending: Posey Pronto, MD   Room: OTF/OTF Time Dictated: 7:24 PM APP: Harley Hallmark, PA       Chief Complaint   Chief Complaint   Patient presents with   ??? Skin Problem     rectal lesion       History of Present Illness   31 y.o. female with past medical history significant for endometriosis presents to the ED, accompanied by boyfriend, complaining of constant rectal pain and rash starting today. She states she did experience rectal itching yesterday but not pain until today. Denies fever, hematochezia, urinary symptoms, or abdominal pain. She denies personal history of herpes but medical records reveal otherwise.   States the pain is constant.  She denies any pain with urination or any pain with defecation.  Denies any bleeding.  Just states that the pain is constant and a burning pain in the rectal region.  She denies any similar symptoms in the past.    Review of Systems   Review of Systems   Constitutional: Negative for fever.   Gastrointestinal: Negative for abdominal pain and blood in stool.   Genitourinary: Negative for dysuria, hematuria and urgency.        +rectal pain and rash     Past Medical/Surgical History     Past Medical History:   Diagnosis Date   ??? Asthma    ??? Bacterial vaginosis    ??? Bronchitis    ??? Endometriosis    ??? Genital herpes      Past Surgical History:   Procedure Laterality Date   ??? HX APPENDECTOMY     ??? HX CHOLECYSTECTOMY     ??? HX GYN      laproscopy   ??? HX HEENT     ??? HX HYSTERECTOMY         Social History     Social History     Socioeconomic History   ??? Marital status: SINGLE     Spouse name: Not on file   ??? Number of children: Not on file   ??? Years of education: Not on file   ??? Highest education level: Not on file   Social Needs   ??? Financial  resource strain: Not on file   ??? Food insecurity - worry: Not on file   ??? Food insecurity - inability: Not on file   ??? Transportation needs - medical: Not on file   ??? Transportation needs - non-medical: Not on file   Occupational History   ??? Not on file   Tobacco Use   ??? Smoking status: Never Smoker   ??? Smokeless tobacco: Never Used   Substance and Sexual Activity   ??? Alcohol use: No   ??? Drug use: No   ??? Sexual activity: Not on file   Other Topics Concern   ??? Not on file   Social History Narrative   ??? Not on file       Family History   No family history on file.    Current Medications     Prior to Admission Medications   Prescriptions Last Dose Informant Patient Reported? Taking?   albuterol (PROVENTIL HFA, VENTOLIN HFA, PROAIR HFA) 90 mcg/actuation inhaler   No No   Sig: Take  2 Puffs by inhalation every four (4) hours as needed for Wheezing.   naproxen (NAPROSYN) 375 mg tablet   No No   Sig: Take 1 Tab by mouth two (2) times daily (with meals).      Facility-Administered Medications: None       Allergies     Allergies   Allergen Reactions   ??? Latex Hives   ??? Aspirin Anaphylaxis   ??? Morphine Rash       Physical Exam     ED Triage Vitals   ED Encounter Vitals Group      BP 04/06/17 1916 128/76      Pulse (Heart Rate) 04/06/17 1916 91      Resp Rate 04/06/17 1916 18      Temp 04/06/17 1916 97.6 ??F (36.4 ??C)      Temp src --       O2 Sat (%) 04/06/17 1916 97 %      Weight --       Height 04/06/17 1904 5\' 3"      Physical Exam   Constitutional: She is oriented to person, place, and time.   Well developed, well-nourished   HENT:   Head: Normocephalic and atraumatic.   Right Ear: External ear normal.   Left Ear: External ear normal.   Eyes: Conjunctivae are normal. Right eye exhibits no discharge. Left eye exhibits no discharge. No scleral icterus.   Abdominal: Soft. She exhibits no distension. There is no tenderness. There is no rebound and no guarding.   Neurological: She is alert and oriented to person, place, and time.    Skin:   She has a small erythematous vesicular like lesion that is in the perianal region no fluctuance no discharge no signs of abscess this appears to be herpes   Psychiatric: Mood and affect normal.       Impression and Management Plan   Patient presents with a painful lesion in the perirectal region.  She denies any history of herpes however medical records note that she was previously evaluated a few months ago for a very similar rash in the same region.  This was tested and found to be positive for herpes simplex.  I suspect this is a recurrent outbreak.  I have discussed this with her and she still denies being evaluated in the past.  Again no rashes in the same region previously noted on her medical records.  No further testing necessary at this time.  She has just followed up with gynecology and states her Pap smear and everything else was normal.  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    Diagnostic Studies     No results found for any visits on 04/06/17.      Medical Decision Making/ED Course   Patient was given a prescription for acyclovir.  She can take over-the-counter pain medications as needed.  I recommend she follow-up with the health department for further STD testing including HIV and syphilis if she was not tested for these recently by her GYN.  Discussed that her medical records do note that she was previously tested and found to be positive for herpes.  I have advised her treatment for herpes but that medication will likely lessen the duration of her outbreak.    Final Diagnosis        ICD-10-CM ICD-9-CM   1. Herpes B00.9 054.9       Disposition   Discharge home  Scribe Attestation     [employer]i Amin acting as a Neurosurgeonscribe for and in the presence of Erskin BurnetLena Mckensie Scotti, PA-C  April 06, 2017 at 7:34 PM       Provider Attestation:         I personally performed the services described in the documentation, reviewed the documentation, as recorded by the scribe in  my presence, and it accurately and completely records my words and actions. April 06, 2017 at 7:34 PM - Erskin BurnetLena Keyden Pavlov, PA-C    The patient was personally evaluated by myself.  I discussed the case with Dr. Truddie CrumbleSTAMBAUGH, Jonelle SportsOBERT E, MD who agrees with the above assessment and plan.      Erskin BurnetLena Zaniah [place]itterington, PA-C  April 06, 2017      My signature above authenticates this document and my orders, the final ??  diagnosis (es), discharge prescription (s), and instructions in the Epic ??  record.  If you have any questions please contact [phone].  ??  Nursing notes have been reviewed by the physician/ advanced practice Clinician.

## 2017-04-06 NOTE — ED Notes (Signed)
8:06 PM  04/06/17     Discharge instructions given to pt (name) with verbalization of understanding. Patient accompanied by friend.  Patient discharged with the following prescriptions zovirax. Patient discharged to home (destination).      Bynum BellowsLisa Shoemaker, RN

## 2017-04-07 ENCOUNTER — Inpatient Hospital Stay
Admit: 2017-04-07 | Discharge: 2017-04-07 | Disposition: A | Discharge status: Home / Self Care | Payer: PRIVATE HEALTH INSURANCE | Attending: Emergency Medicine

## 2017-04-07 MED ORDER — ACYCLOVIR 400 MG TAB
400 mg | ORAL_TABLET | Freq: Three times a day (TID) | ORAL | 0 refills | Status: AC
Start: 2017-04-07 — End: 2017-04-11
